# Patient Record
Sex: Female | Born: 1950
Health system: Southern US, Community
[De-identification: ages and names within clinical notes are randomized; demographics above are authoritative.]

## PROBLEM LIST (undated history)

## (undated) DIAGNOSIS — K219 Gastro-esophageal reflux disease without esophagitis: Secondary | ICD-10-CM

## (undated) DIAGNOSIS — E039 Hypothyroidism, unspecified: Secondary | ICD-10-CM

## (undated) DIAGNOSIS — E079 Disorder of thyroid, unspecified: Secondary | ICD-10-CM

## (undated) DIAGNOSIS — I10 Essential (primary) hypertension: Secondary | ICD-10-CM

## (undated) DIAGNOSIS — E785 Hyperlipidemia, unspecified: Secondary | ICD-10-CM

## (undated) DIAGNOSIS — K5790 Diverticulosis of intestine, part unspecified, without perforation or abscess without bleeding: Secondary | ICD-10-CM

## (undated) HISTORY — PX: TUBAL LIGATION: SHX77

## (undated) HISTORY — DX: Disorder of thyroid, unspecified: E07.9

## (undated) HISTORY — DX: Gastro-esophageal reflux disease without esophagitis: K21.9

## (undated) HISTORY — PX: ESOPHAGOGASTRODUODENOSCOPY: SHX1529

## (undated) HISTORY — DX: Hypothyroidism, unspecified: E03.9

## (undated) HISTORY — PX: EYE SURGERY: SHX253

## (undated) HISTORY — DX: Diverticulosis of intestine, part unspecified, without perforation or abscess without bleeding: K57.90

## (undated) HISTORY — PX: OTHER SURGICAL HISTORY: SHX169

## (undated) HISTORY — DX: Essential (primary) hypertension: I10

## (undated) HISTORY — DX: Hyperlipidemia, unspecified: E78.5

---

## 2007-02-02 ENCOUNTER — Other Ambulatory Visit: Payer: Self-pay

## 2007-02-02 ENCOUNTER — Emergency Department: Payer: Self-pay | Admitting: Emergency Medicine

## 2007-02-03 ENCOUNTER — Ambulatory Visit: Payer: Self-pay | Admitting: Emergency Medicine

## 2007-11-28 ENCOUNTER — Ambulatory Visit: Payer: Self-pay | Admitting: Gastroenterology

## 2009-03-25 ENCOUNTER — Ambulatory Visit: Payer: Self-pay | Admitting: Family Medicine

## 2010-11-02 ENCOUNTER — Ambulatory Visit: Payer: Self-pay | Admitting: Gastroenterology

## 2013-08-13 ENCOUNTER — Emergency Department: Payer: Self-pay | Admitting: Emergency Medicine

## 2013-08-13 LAB — CBC
HGB: 12.7 g/dL (ref 12.0–16.0)
MCHC: 34.2 g/dL (ref 32.0–36.0)
Platelet: 169 10*3/uL (ref 150–440)
RBC: 3.75 10*6/uL — ABNORMAL LOW (ref 3.80–5.20)
RDW: 12.8 % (ref 11.5–14.5)
WBC: 9.1 10*3/uL (ref 3.6–11.0)

## 2013-08-13 LAB — COMPREHENSIVE METABOLIC PANEL
Albumin: 3.9 g/dL (ref 3.4–5.0)
Alkaline Phosphatase: 54 U/L (ref 50–136)
Bilirubin,Total: 0.4 mg/dL (ref 0.2–1.0)
Chloride: 104 mmol/L (ref 98–107)
Co2: 26 mmol/L (ref 21–32)
EGFR (African American): >60
Glucose: 106 mg/dL — ABNORMAL HIGH (ref 65–99)
Potassium: 3.8 mmol/L (ref 3.5–5.1)
SGOT(AST): 18 U/L (ref 15–37)
SGPT (ALT): 30 U/L (ref 12–78)

## 2013-08-13 LAB — URINALYSIS, COMPLETE
Bilirubin,UR: NEGATIVE
Glucose,UR: NEGATIVE mg/dL (ref 0–75)
Nitrite: NEGATIVE
Ph: 5 (ref 4.5–8.0)
Squamous Epithelial: 3
WBC UR: 3 /HPF (ref 0–5)

## 2013-08-13 LAB — LIPASE, BLOOD: Lipase: 94 U/L (ref 73–393)

## 2014-03-25 DIAGNOSIS — E039 Hypothyroidism, unspecified: Secondary | ICD-10-CM | POA: Insufficient documentation

## 2014-03-25 DIAGNOSIS — I10 Essential (primary) hypertension: Secondary | ICD-10-CM | POA: Insufficient documentation

## 2014-03-25 DIAGNOSIS — K219 Gastro-esophageal reflux disease without esophagitis: Secondary | ICD-10-CM | POA: Insufficient documentation

## 2014-03-25 DIAGNOSIS — E785 Hyperlipidemia, unspecified: Secondary | ICD-10-CM | POA: Insufficient documentation

## 2014-04-12 DIAGNOSIS — Z87442 Personal history of urinary calculi: Secondary | ICD-10-CM | POA: Insufficient documentation

## 2015-03-19 ENCOUNTER — Ambulatory Visit
Admit: 2015-03-19 | Disposition: A | Discharge status: Home / Self Care | Payer: Self-pay | Attending: Gastroenterology | Admitting: Gastroenterology

## 2015-03-24 LAB — SURGICAL PATHOLOGY

## 2015-06-23 DIAGNOSIS — H47323 Drusen of optic disc, bilateral: Secondary | ICD-10-CM | POA: Insufficient documentation

## 2017-06-03 DIAGNOSIS — G5603 Carpal tunnel syndrome, bilateral upper limbs: Secondary | ICD-10-CM | POA: Insufficient documentation

## 2017-06-03 DIAGNOSIS — M1712 Unilateral primary osteoarthritis, left knee: Secondary | ICD-10-CM | POA: Insufficient documentation

## 2017-06-05 DIAGNOSIS — R011 Cardiac murmur, unspecified: Secondary | ICD-10-CM | POA: Insufficient documentation

## 2017-12-05 DIAGNOSIS — M51369 Other intervertebral disc degeneration, lumbar region without mention of lumbar back pain or lower extremity pain: Secondary | ICD-10-CM | POA: Insufficient documentation

## 2017-12-05 DIAGNOSIS — R7302 Impaired glucose tolerance (oral): Secondary | ICD-10-CM | POA: Insufficient documentation

## 2018-02-28 DIAGNOSIS — H2513 Age-related nuclear cataract, bilateral: Secondary | ICD-10-CM | POA: Diagnosis not present

## 2018-02-28 DIAGNOSIS — H1851 Endothelial corneal dystrophy: Secondary | ICD-10-CM | POA: Diagnosis not present

## 2018-02-28 DIAGNOSIS — H40033 Anatomical narrow angle, bilateral: Secondary | ICD-10-CM | POA: Diagnosis not present

## 2018-03-03 DIAGNOSIS — E782 Mixed hyperlipidemia: Secondary | ICD-10-CM | POA: Diagnosis not present

## 2018-03-03 DIAGNOSIS — I10 Essential (primary) hypertension: Secondary | ICD-10-CM | POA: Diagnosis not present

## 2018-03-03 DIAGNOSIS — Z0000 Encounter for general adult medical examination without abnormal findings: Secondary | ICD-10-CM | POA: Diagnosis not present

## 2018-03-03 DIAGNOSIS — M5136 Other intervertebral disc degeneration, lumbar region: Secondary | ICD-10-CM | POA: Diagnosis not present

## 2018-03-03 DIAGNOSIS — E039 Hypothyroidism, unspecified: Secondary | ICD-10-CM | POA: Diagnosis not present

## 2018-03-03 DIAGNOSIS — K219 Gastro-esophageal reflux disease without esophagitis: Secondary | ICD-10-CM | POA: Diagnosis not present

## 2018-05-29 DIAGNOSIS — H2511 Age-related nuclear cataract, right eye: Secondary | ICD-10-CM | POA: Diagnosis not present

## 2018-05-29 DIAGNOSIS — H1851 Endothelial corneal dystrophy: Secondary | ICD-10-CM | POA: Diagnosis not present

## 2018-05-29 DIAGNOSIS — I10 Essential (primary) hypertension: Secondary | ICD-10-CM | POA: Diagnosis not present

## 2018-05-29 DIAGNOSIS — Z7982 Long term (current) use of aspirin: Secondary | ICD-10-CM | POA: Diagnosis not present

## 2018-05-29 DIAGNOSIS — K219 Gastro-esophageal reflux disease without esophagitis: Secondary | ICD-10-CM | POA: Diagnosis not present

## 2018-05-29 DIAGNOSIS — Z79899 Other long term (current) drug therapy: Secondary | ICD-10-CM | POA: Diagnosis not present

## 2018-05-29 DIAGNOSIS — E785 Hyperlipidemia, unspecified: Secondary | ICD-10-CM | POA: Diagnosis not present

## 2018-05-29 DIAGNOSIS — E039 Hypothyroidism, unspecified: Secondary | ICD-10-CM | POA: Diagnosis not present

## 2018-07-03 DIAGNOSIS — R21 Rash and other nonspecific skin eruption: Secondary | ICD-10-CM | POA: Diagnosis not present

## 2018-08-21 DIAGNOSIS — R399 Unspecified symptoms and signs involving the genitourinary system: Secondary | ICD-10-CM | POA: Diagnosis not present

## 2018-08-21 DIAGNOSIS — R300 Dysuria: Secondary | ICD-10-CM | POA: Diagnosis not present

## 2018-08-21 DIAGNOSIS — R8290 Unspecified abnormal findings in urine: Secondary | ICD-10-CM | POA: Diagnosis not present

## 2018-08-21 DIAGNOSIS — M25561 Pain in right knee: Secondary | ICD-10-CM | POA: Diagnosis not present

## 2018-08-29 DIAGNOSIS — I10 Essential (primary) hypertension: Secondary | ICD-10-CM | POA: Diagnosis not present

## 2018-08-29 DIAGNOSIS — E039 Hypothyroidism, unspecified: Secondary | ICD-10-CM | POA: Diagnosis not present

## 2018-08-29 DIAGNOSIS — E782 Mixed hyperlipidemia: Secondary | ICD-10-CM | POA: Diagnosis not present

## 2018-09-05 DIAGNOSIS — M5136 Other intervertebral disc degeneration, lumbar region: Secondary | ICD-10-CM | POA: Diagnosis not present

## 2018-09-05 DIAGNOSIS — Z1239 Encounter for other screening for malignant neoplasm of breast: Secondary | ICD-10-CM | POA: Diagnosis not present

## 2018-09-05 DIAGNOSIS — I10 Essential (primary) hypertension: Secondary | ICD-10-CM | POA: Diagnosis not present

## 2018-09-05 DIAGNOSIS — E039 Hypothyroidism, unspecified: Secondary | ICD-10-CM | POA: Diagnosis not present

## 2018-09-05 DIAGNOSIS — E782 Mixed hyperlipidemia: Secondary | ICD-10-CM | POA: Diagnosis not present

## 2018-09-05 DIAGNOSIS — K219 Gastro-esophageal reflux disease without esophagitis: Secondary | ICD-10-CM | POA: Diagnosis not present

## 2018-09-11 DIAGNOSIS — H25012 Cortical age-related cataract, left eye: Secondary | ICD-10-CM | POA: Diagnosis not present

## 2018-09-11 DIAGNOSIS — Z880 Allergy status to penicillin: Secondary | ICD-10-CM | POA: Diagnosis not present

## 2018-09-11 DIAGNOSIS — Z79899 Other long term (current) drug therapy: Secondary | ICD-10-CM | POA: Diagnosis not present

## 2018-09-11 DIAGNOSIS — H1851 Endothelial corneal dystrophy: Secondary | ICD-10-CM | POA: Diagnosis not present

## 2018-09-11 DIAGNOSIS — E039 Hypothyroidism, unspecified: Secondary | ICD-10-CM | POA: Diagnosis not present

## 2018-09-11 DIAGNOSIS — K219 Gastro-esophageal reflux disease without esophagitis: Secondary | ICD-10-CM | POA: Diagnosis not present

## 2018-09-11 DIAGNOSIS — I10 Essential (primary) hypertension: Secondary | ICD-10-CM | POA: Diagnosis not present

## 2018-09-11 DIAGNOSIS — Z882 Allergy status to sulfonamides status: Secondary | ICD-10-CM | POA: Diagnosis not present

## 2018-09-11 DIAGNOSIS — Z7982 Long term (current) use of aspirin: Secondary | ICD-10-CM | POA: Diagnosis not present

## 2018-09-21 DIAGNOSIS — Z947 Corneal transplant status: Secondary | ICD-10-CM | POA: Diagnosis not present

## 2018-12-04 ENCOUNTER — Other Ambulatory Visit: Payer: Self-pay | Admitting: Internal Medicine

## 2018-12-04 DIAGNOSIS — Z1231 Encounter for screening mammogram for malignant neoplasm of breast: Secondary | ICD-10-CM

## 2018-12-12 DIAGNOSIS — M26622 Arthralgia of left temporomandibular joint: Secondary | ICD-10-CM | POA: Diagnosis not present

## 2018-12-12 DIAGNOSIS — R300 Dysuria: Secondary | ICD-10-CM | POA: Diagnosis not present

## 2018-12-12 DIAGNOSIS — R600 Localized edema: Secondary | ICD-10-CM | POA: Diagnosis not present

## 2018-12-20 ENCOUNTER — Encounter (HOSPITAL_COMMUNITY): Payer: Self-pay

## 2018-12-20 ENCOUNTER — Ambulatory Visit
Admission: RE | Admit: 2018-12-20 | Discharge: 2018-12-20 | Disposition: A | Discharge status: Home / Self Care | Payer: Medicare HMO | Source: Ambulatory Visit | Attending: Internal Medicine | Admitting: Internal Medicine

## 2018-12-20 DIAGNOSIS — Z1231 Encounter for screening mammogram for malignant neoplasm of breast: Secondary | ICD-10-CM | POA: Diagnosis not present

## 2019-01-02 DIAGNOSIS — Z1211 Encounter for screening for malignant neoplasm of colon: Secondary | ICD-10-CM | POA: Diagnosis not present

## 2019-01-02 DIAGNOSIS — Z01419 Encounter for gynecological examination (general) (routine) without abnormal findings: Secondary | ICD-10-CM | POA: Diagnosis not present

## 2019-01-02 DIAGNOSIS — Z124 Encounter for screening for malignant neoplasm of cervix: Secondary | ICD-10-CM | POA: Diagnosis not present

## 2019-01-22 DIAGNOSIS — Z1211 Encounter for screening for malignant neoplasm of colon: Secondary | ICD-10-CM | POA: Diagnosis not present

## 2019-02-12 DIAGNOSIS — H1851 Endothelial corneal dystrophy: Secondary | ICD-10-CM | POA: Diagnosis not present

## 2019-02-12 DIAGNOSIS — H47323 Drusen of optic disc, bilateral: Secondary | ICD-10-CM | POA: Diagnosis not present

## 2019-02-12 DIAGNOSIS — Z947 Corneal transplant status: Secondary | ICD-10-CM | POA: Diagnosis not present

## 2019-02-12 DIAGNOSIS — H40023 Open angle with borderline findings, high risk, bilateral: Secondary | ICD-10-CM | POA: Insufficient documentation

## 2019-02-27 DIAGNOSIS — E039 Hypothyroidism, unspecified: Secondary | ICD-10-CM | POA: Diagnosis not present

## 2019-02-27 DIAGNOSIS — I10 Essential (primary) hypertension: Secondary | ICD-10-CM | POA: Diagnosis not present

## 2019-03-07 DIAGNOSIS — E039 Hypothyroidism, unspecified: Secondary | ICD-10-CM | POA: Diagnosis not present

## 2019-03-07 DIAGNOSIS — E782 Mixed hyperlipidemia: Secondary | ICD-10-CM | POA: Diagnosis not present

## 2019-03-07 DIAGNOSIS — Z0001 Encounter for general adult medical examination with abnormal findings: Secondary | ICD-10-CM | POA: Diagnosis not present

## 2019-03-07 DIAGNOSIS — Z0000 Encounter for general adult medical examination without abnormal findings: Secondary | ICD-10-CM | POA: Diagnosis not present

## 2019-03-07 DIAGNOSIS — I10 Essential (primary) hypertension: Secondary | ICD-10-CM | POA: Diagnosis not present

## 2019-05-03 DIAGNOSIS — L237 Allergic contact dermatitis due to plants, except food: Secondary | ICD-10-CM | POA: Diagnosis not present

## 2019-08-16 DIAGNOSIS — H1851 Endothelial corneal dystrophy: Secondary | ICD-10-CM | POA: Diagnosis not present

## 2019-08-16 DIAGNOSIS — Z947 Corneal transplant status: Secondary | ICD-10-CM | POA: Diagnosis not present

## 2019-08-16 DIAGNOSIS — H40023 Open angle with borderline findings, high risk, bilateral: Secondary | ICD-10-CM | POA: Diagnosis not present

## 2019-08-31 DIAGNOSIS — I10 Essential (primary) hypertension: Secondary | ICD-10-CM | POA: Diagnosis not present

## 2019-08-31 DIAGNOSIS — E782 Mixed hyperlipidemia: Secondary | ICD-10-CM | POA: Diagnosis not present

## 2019-08-31 DIAGNOSIS — E039 Hypothyroidism, unspecified: Secondary | ICD-10-CM | POA: Diagnosis not present

## 2019-09-06 DIAGNOSIS — E782 Mixed hyperlipidemia: Secondary | ICD-10-CM | POA: Diagnosis not present

## 2019-09-06 DIAGNOSIS — E039 Hypothyroidism, unspecified: Secondary | ICD-10-CM | POA: Diagnosis not present

## 2019-09-06 DIAGNOSIS — K219 Gastro-esophageal reflux disease without esophagitis: Secondary | ICD-10-CM | POA: Diagnosis not present

## 2019-09-06 DIAGNOSIS — R002 Palpitations: Secondary | ICD-10-CM | POA: Diagnosis not present

## 2019-09-06 DIAGNOSIS — I10 Essential (primary) hypertension: Secondary | ICD-10-CM | POA: Diagnosis not present

## 2019-09-11 DIAGNOSIS — Z0100 Encounter for examination of eyes and vision without abnormal findings: Secondary | ICD-10-CM | POA: Diagnosis not present

## 2019-10-19 DIAGNOSIS — R05 Cough: Secondary | ICD-10-CM | POA: Diagnosis not present

## 2019-10-19 DIAGNOSIS — Z20828 Contact with and (suspected) exposure to other viral communicable diseases: Secondary | ICD-10-CM | POA: Diagnosis not present

## 2019-11-16 ENCOUNTER — Other Ambulatory Visit: Payer: Self-pay | Admitting: Internal Medicine

## 2019-11-16 DIAGNOSIS — Z1231 Encounter for screening mammogram for malignant neoplasm of breast: Secondary | ICD-10-CM

## 2019-12-18 DIAGNOSIS — K649 Unspecified hemorrhoids: Secondary | ICD-10-CM | POA: Diagnosis not present

## 2019-12-26 ENCOUNTER — Ambulatory Visit
Admission: RE | Admit: 2019-12-26 | Discharge: 2019-12-26 | Disposition: A | Discharge status: Home / Self Care | Payer: Medicare HMO | Source: Ambulatory Visit | Attending: Internal Medicine | Admitting: Internal Medicine

## 2019-12-26 DIAGNOSIS — Z1231 Encounter for screening mammogram for malignant neoplasm of breast: Secondary | ICD-10-CM | POA: Insufficient documentation

## 2020-01-15 DIAGNOSIS — Z124 Encounter for screening for malignant neoplasm of cervix: Secondary | ICD-10-CM | POA: Diagnosis not present

## 2020-01-30 DIAGNOSIS — Z1211 Encounter for screening for malignant neoplasm of colon: Secondary | ICD-10-CM | POA: Diagnosis not present

## 2020-01-31 DIAGNOSIS — Z01812 Encounter for preprocedural laboratory examination: Secondary | ICD-10-CM | POA: Diagnosis not present

## 2020-01-31 DIAGNOSIS — I10 Essential (primary) hypertension: Secondary | ICD-10-CM | POA: Diagnosis not present

## 2020-01-31 DIAGNOSIS — Z86010 Personal history of colonic polyps: Secondary | ICD-10-CM | POA: Diagnosis not present

## 2020-01-31 DIAGNOSIS — E785 Hyperlipidemia, unspecified: Secondary | ICD-10-CM | POA: Diagnosis not present

## 2020-01-31 DIAGNOSIS — K219 Gastro-esophageal reflux disease without esophagitis: Secondary | ICD-10-CM | POA: Diagnosis not present

## 2020-01-31 DIAGNOSIS — K649 Unspecified hemorrhoids: Secondary | ICD-10-CM | POA: Diagnosis not present

## 2020-01-31 DIAGNOSIS — R1310 Dysphagia, unspecified: Secondary | ICD-10-CM | POA: Diagnosis not present

## 2020-01-31 DIAGNOSIS — K5730 Diverticulosis of large intestine without perforation or abscess without bleeding: Secondary | ICD-10-CM | POA: Diagnosis not present

## 2020-02-12 DIAGNOSIS — H47323 Drusen of optic disc, bilateral: Secondary | ICD-10-CM | POA: Diagnosis not present

## 2020-02-12 DIAGNOSIS — H4051X3 Glaucoma secondary to other eye disorders, right eye, severe stage: Secondary | ICD-10-CM | POA: Diagnosis not present

## 2020-02-12 DIAGNOSIS — H18519 Endothelial corneal dystrophy, unspecified eye: Secondary | ICD-10-CM | POA: Diagnosis not present

## 2020-02-12 DIAGNOSIS — H04123 Dry eye syndrome of bilateral lacrimal glands: Secondary | ICD-10-CM | POA: Diagnosis not present

## 2020-03-05 DIAGNOSIS — E782 Mixed hyperlipidemia: Secondary | ICD-10-CM | POA: Diagnosis not present

## 2020-03-05 DIAGNOSIS — E039 Hypothyroidism, unspecified: Secondary | ICD-10-CM | POA: Diagnosis not present

## 2020-03-05 DIAGNOSIS — I10 Essential (primary) hypertension: Secondary | ICD-10-CM | POA: Diagnosis not present

## 2020-03-12 DIAGNOSIS — M25562 Pain in left knee: Secondary | ICD-10-CM | POA: Diagnosis not present

## 2020-03-12 DIAGNOSIS — I10 Essential (primary) hypertension: Secondary | ICD-10-CM | POA: Diagnosis not present

## 2020-03-12 DIAGNOSIS — E039 Hypothyroidism, unspecified: Secondary | ICD-10-CM | POA: Diagnosis not present

## 2020-03-12 DIAGNOSIS — R0602 Shortness of breath: Secondary | ICD-10-CM | POA: Diagnosis not present

## 2020-03-12 DIAGNOSIS — Z0000 Encounter for general adult medical examination without abnormal findings: Secondary | ICD-10-CM | POA: Diagnosis not present

## 2020-03-17 DIAGNOSIS — M25562 Pain in left knee: Secondary | ICD-10-CM | POA: Diagnosis not present

## 2020-03-17 DIAGNOSIS — M7050 Other bursitis of knee, unspecified knee: Secondary | ICD-10-CM | POA: Diagnosis not present

## 2020-03-17 DIAGNOSIS — M25561 Pain in right knee: Secondary | ICD-10-CM | POA: Diagnosis not present

## 2020-03-17 DIAGNOSIS — M2392 Unspecified internal derangement of left knee: Secondary | ICD-10-CM | POA: Diagnosis not present

## 2020-03-18 DIAGNOSIS — Z01812 Encounter for preprocedural laboratory examination: Secondary | ICD-10-CM | POA: Diagnosis not present

## 2020-03-21 DIAGNOSIS — R1310 Dysphagia, unspecified: Secondary | ICD-10-CM | POA: Diagnosis not present

## 2020-03-21 DIAGNOSIS — D128 Benign neoplasm of rectum: Secondary | ICD-10-CM | POA: Diagnosis not present

## 2020-03-21 DIAGNOSIS — K222 Esophageal obstruction: Secondary | ICD-10-CM | POA: Diagnosis not present

## 2020-03-21 DIAGNOSIS — K219 Gastro-esophageal reflux disease without esophagitis: Secondary | ICD-10-CM | POA: Diagnosis not present

## 2020-03-21 DIAGNOSIS — K621 Rectal polyp: Secondary | ICD-10-CM | POA: Diagnosis not present

## 2020-03-21 DIAGNOSIS — K641 Second degree hemorrhoids: Secondary | ICD-10-CM | POA: Diagnosis not present

## 2020-03-21 DIAGNOSIS — Z1211 Encounter for screening for malignant neoplasm of colon: Secondary | ICD-10-CM | POA: Diagnosis not present

## 2020-03-21 DIAGNOSIS — K449 Diaphragmatic hernia without obstruction or gangrene: Secondary | ICD-10-CM | POA: Diagnosis not present

## 2020-03-21 DIAGNOSIS — K5730 Diverticulosis of large intestine without perforation or abscess without bleeding: Secondary | ICD-10-CM | POA: Diagnosis not present

## 2020-03-21 DIAGNOSIS — Z86010 Personal history of colonic polyps: Secondary | ICD-10-CM | POA: Diagnosis not present

## 2020-03-28 DIAGNOSIS — R0602 Shortness of breath: Secondary | ICD-10-CM | POA: Diagnosis not present

## 2020-03-31 DIAGNOSIS — M7050 Other bursitis of knee, unspecified knee: Secondary | ICD-10-CM | POA: Diagnosis not present

## 2020-04-01 DIAGNOSIS — H4051X3 Glaucoma secondary to other eye disorders, right eye, severe stage: Secondary | ICD-10-CM | POA: Diagnosis not present

## 2020-04-02 DIAGNOSIS — I340 Nonrheumatic mitral (valve) insufficiency: Secondary | ICD-10-CM | POA: Insufficient documentation

## 2020-04-02 DIAGNOSIS — E785 Hyperlipidemia, unspecified: Secondary | ICD-10-CM | POA: Diagnosis not present

## 2020-04-02 DIAGNOSIS — I10 Essential (primary) hypertension: Secondary | ICD-10-CM | POA: Diagnosis not present

## 2020-04-02 DIAGNOSIS — I361 Nonrheumatic tricuspid (valve) insufficiency: Secondary | ICD-10-CM | POA: Insufficient documentation

## 2020-04-02 DIAGNOSIS — R0602 Shortness of breath: Secondary | ICD-10-CM | POA: Diagnosis not present

## 2020-07-07 DIAGNOSIS — E785 Hyperlipidemia, unspecified: Secondary | ICD-10-CM | POA: Diagnosis not present

## 2020-07-07 DIAGNOSIS — R0602 Shortness of breath: Secondary | ICD-10-CM | POA: Diagnosis not present

## 2020-07-07 DIAGNOSIS — I10 Essential (primary) hypertension: Secondary | ICD-10-CM | POA: Diagnosis not present

## 2020-07-07 DIAGNOSIS — I340 Nonrheumatic mitral (valve) insufficiency: Secondary | ICD-10-CM | POA: Diagnosis not present

## 2020-09-04 DIAGNOSIS — E039 Hypothyroidism, unspecified: Secondary | ICD-10-CM | POA: Diagnosis not present

## 2020-09-11 DIAGNOSIS — R21 Rash and other nonspecific skin eruption: Secondary | ICD-10-CM | POA: Diagnosis not present

## 2020-09-11 DIAGNOSIS — E039 Hypothyroidism, unspecified: Secondary | ICD-10-CM | POA: Diagnosis not present

## 2020-09-11 DIAGNOSIS — Z23 Encounter for immunization: Secondary | ICD-10-CM | POA: Diagnosis not present

## 2020-09-11 DIAGNOSIS — E785 Hyperlipidemia, unspecified: Secondary | ICD-10-CM | POA: Diagnosis not present

## 2020-09-11 DIAGNOSIS — I10 Essential (primary) hypertension: Secondary | ICD-10-CM | POA: Diagnosis not present

## 2020-10-07 DIAGNOSIS — H04123 Dry eye syndrome of bilateral lacrimal glands: Secondary | ICD-10-CM | POA: Diagnosis not present

## 2020-10-07 DIAGNOSIS — H47323 Drusen of optic disc, bilateral: Secondary | ICD-10-CM | POA: Diagnosis not present

## 2020-10-07 DIAGNOSIS — H4051X3 Glaucoma secondary to other eye disorders, right eye, severe stage: Secondary | ICD-10-CM | POA: Diagnosis not present

## 2020-10-07 DIAGNOSIS — H18513 Endothelial corneal dystrophy, bilateral: Secondary | ICD-10-CM | POA: Diagnosis not present

## 2020-10-07 DIAGNOSIS — H35363 Drusen (degenerative) of macula, bilateral: Secondary | ICD-10-CM | POA: Diagnosis not present

## 2020-10-27 DIAGNOSIS — R399 Unspecified symptoms and signs involving the genitourinary system: Secondary | ICD-10-CM | POA: Diagnosis not present

## 2020-11-11 ENCOUNTER — Other Ambulatory Visit: Payer: Self-pay | Admitting: Internal Medicine

## 2020-11-11 DIAGNOSIS — Z1231 Encounter for screening mammogram for malignant neoplasm of breast: Secondary | ICD-10-CM

## 2020-12-05 DIAGNOSIS — M5489 Other dorsalgia: Secondary | ICD-10-CM | POA: Diagnosis not present

## 2020-12-05 DIAGNOSIS — M545 Low back pain, unspecified: Secondary | ICD-10-CM | POA: Diagnosis not present

## 2020-12-26 ENCOUNTER — Ambulatory Visit
Admission: RE | Admit: 2020-12-26 | Discharge: 2020-12-26 | Disposition: A | Discharge status: Home / Self Care | Payer: Medicare HMO | Source: Ambulatory Visit | Attending: Internal Medicine | Admitting: Internal Medicine

## 2020-12-26 ENCOUNTER — Other Ambulatory Visit: Payer: Self-pay

## 2020-12-26 DIAGNOSIS — Z1231 Encounter for screening mammogram for malignant neoplasm of breast: Secondary | ICD-10-CM | POA: Diagnosis not present

## 2020-12-29 DIAGNOSIS — R011 Cardiac murmur, unspecified: Secondary | ICD-10-CM | POA: Diagnosis not present

## 2020-12-29 DIAGNOSIS — I10 Essential (primary) hypertension: Secondary | ICD-10-CM | POA: Diagnosis not present

## 2020-12-29 DIAGNOSIS — E785 Hyperlipidemia, unspecified: Secondary | ICD-10-CM | POA: Diagnosis not present

## 2020-12-29 DIAGNOSIS — I361 Nonrheumatic tricuspid (valve) insufficiency: Secondary | ICD-10-CM | POA: Diagnosis not present

## 2020-12-29 DIAGNOSIS — I340 Nonrheumatic mitral (valve) insufficiency: Secondary | ICD-10-CM | POA: Diagnosis not present

## 2021-01-15 DIAGNOSIS — Z1331 Encounter for screening for depression: Secondary | ICD-10-CM | POA: Diagnosis not present

## 2021-01-15 DIAGNOSIS — Z01419 Encounter for gynecological examination (general) (routine) without abnormal findings: Secondary | ICD-10-CM | POA: Diagnosis not present

## 2021-01-15 DIAGNOSIS — Z1321 Encounter for screening for nutritional disorder: Secondary | ICD-10-CM | POA: Diagnosis not present

## 2021-01-21 DIAGNOSIS — M8588 Other specified disorders of bone density and structure, other site: Secondary | ICD-10-CM | POA: Diagnosis not present

## 2021-02-18 DIAGNOSIS — M8588 Other specified disorders of bone density and structure, other site: Secondary | ICD-10-CM | POA: Diagnosis not present

## 2021-02-18 DIAGNOSIS — E559 Vitamin D deficiency, unspecified: Secondary | ICD-10-CM | POA: Diagnosis not present

## 2021-03-09 DIAGNOSIS — E785 Hyperlipidemia, unspecified: Secondary | ICD-10-CM | POA: Diagnosis not present

## 2021-03-09 DIAGNOSIS — E039 Hypothyroidism, unspecified: Secondary | ICD-10-CM | POA: Diagnosis not present

## 2021-03-09 DIAGNOSIS — I10 Essential (primary) hypertension: Secondary | ICD-10-CM | POA: Diagnosis not present

## 2021-03-16 DIAGNOSIS — Z0001 Encounter for general adult medical examination with abnormal findings: Secondary | ICD-10-CM | POA: Diagnosis not present

## 2021-03-16 DIAGNOSIS — I10 Essential (primary) hypertension: Secondary | ICD-10-CM | POA: Diagnosis not present

## 2021-03-16 DIAGNOSIS — R32 Unspecified urinary incontinence: Secondary | ICD-10-CM | POA: Diagnosis not present

## 2021-03-16 DIAGNOSIS — E785 Hyperlipidemia, unspecified: Secondary | ICD-10-CM | POA: Diagnosis not present

## 2021-03-16 DIAGNOSIS — E039 Hypothyroidism, unspecified: Secondary | ICD-10-CM | POA: Diagnosis not present

## 2021-03-16 DIAGNOSIS — Z0000 Encounter for general adult medical examination without abnormal findings: Secondary | ICD-10-CM | POA: Diagnosis not present

## 2021-03-19 IMAGING — MG DIGITAL SCREENING BILAT W/ TOMO W/ CAD
6 of 10 series · 6 of 30 positions shown · non-contrast
Comparison: Previous exam(s).

CLINICAL DATA: Screening.

EXAM:
DIGITAL SCREENING BILATERAL MAMMOGRAM WITH TOMO AND CAD

[R CC synth-2D (1 of 2)]
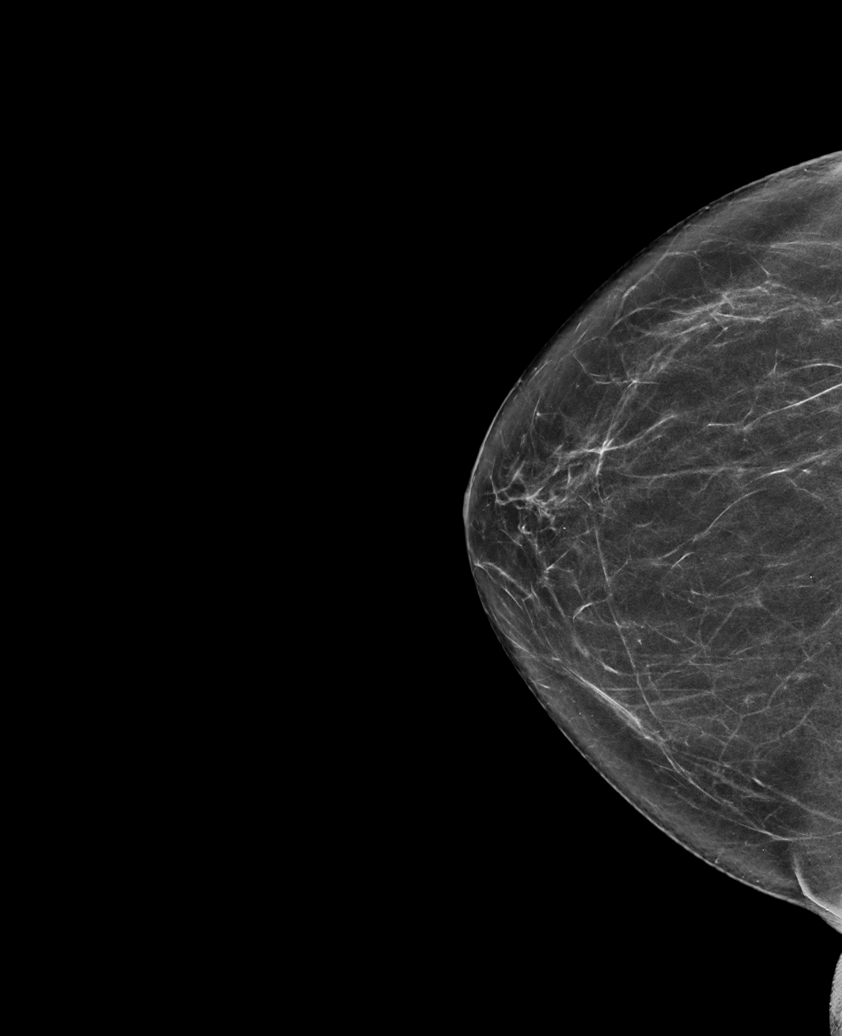

[R MLO synth-2D]
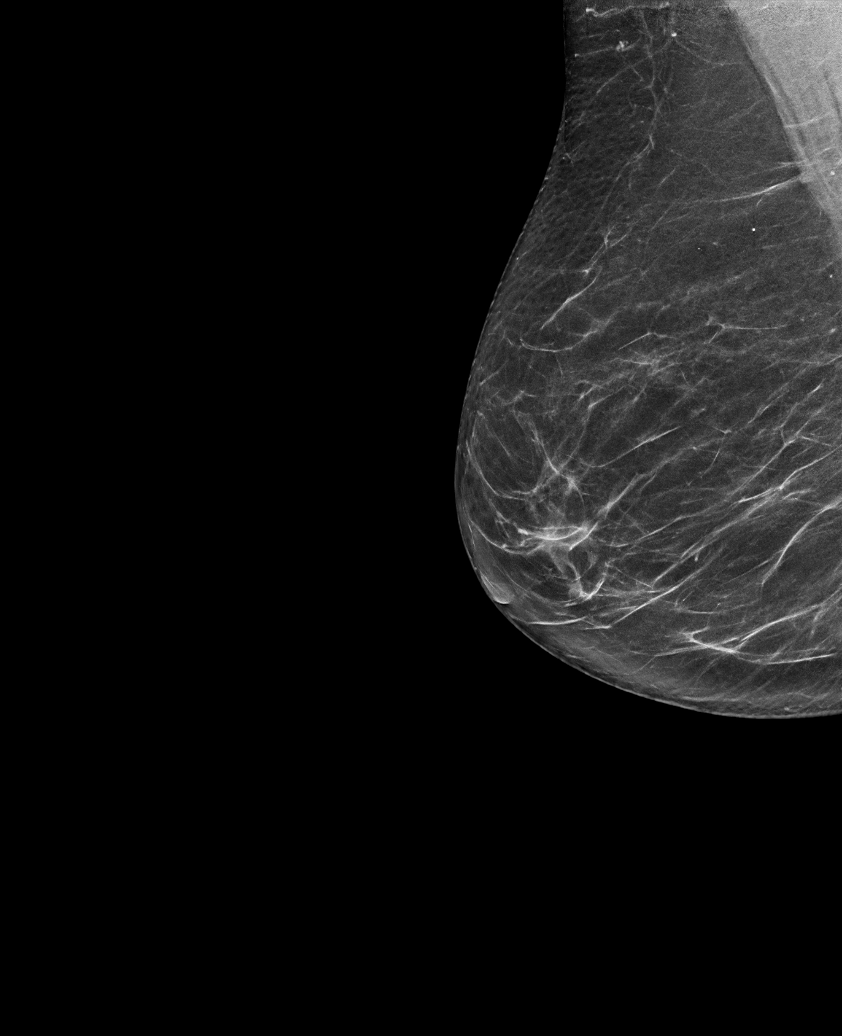

[R CC synth-2D (2 of 2)]
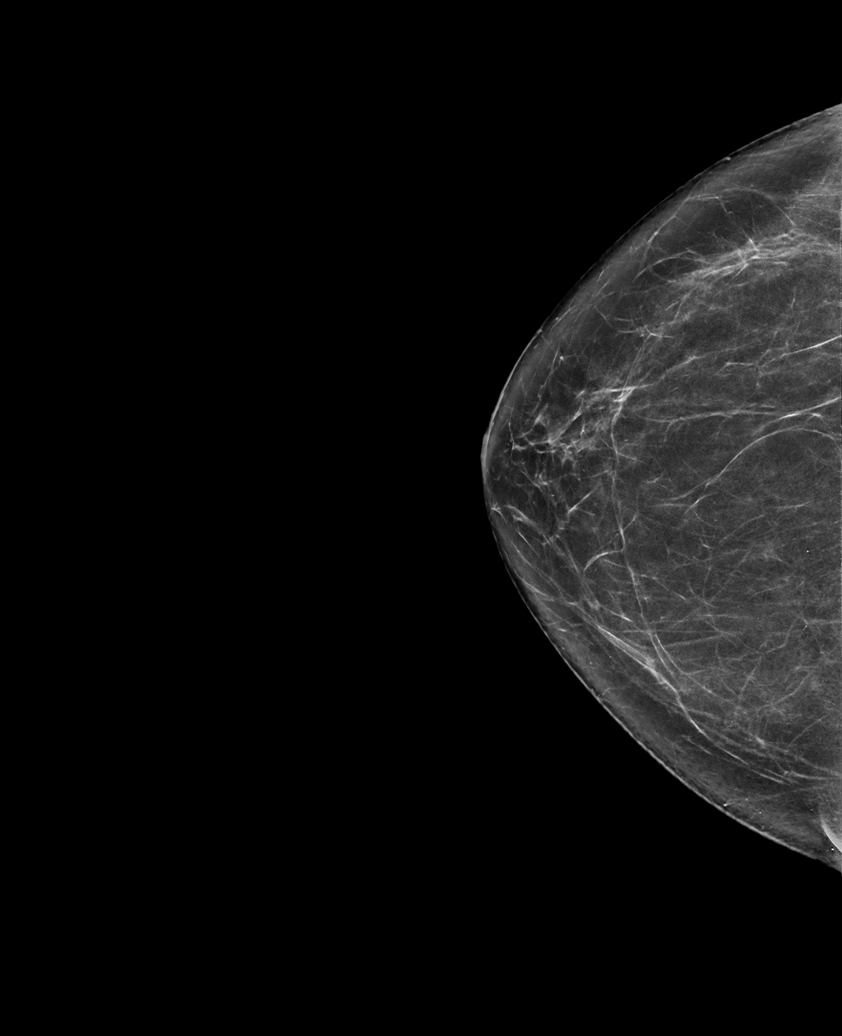

[L CC synth-2D]
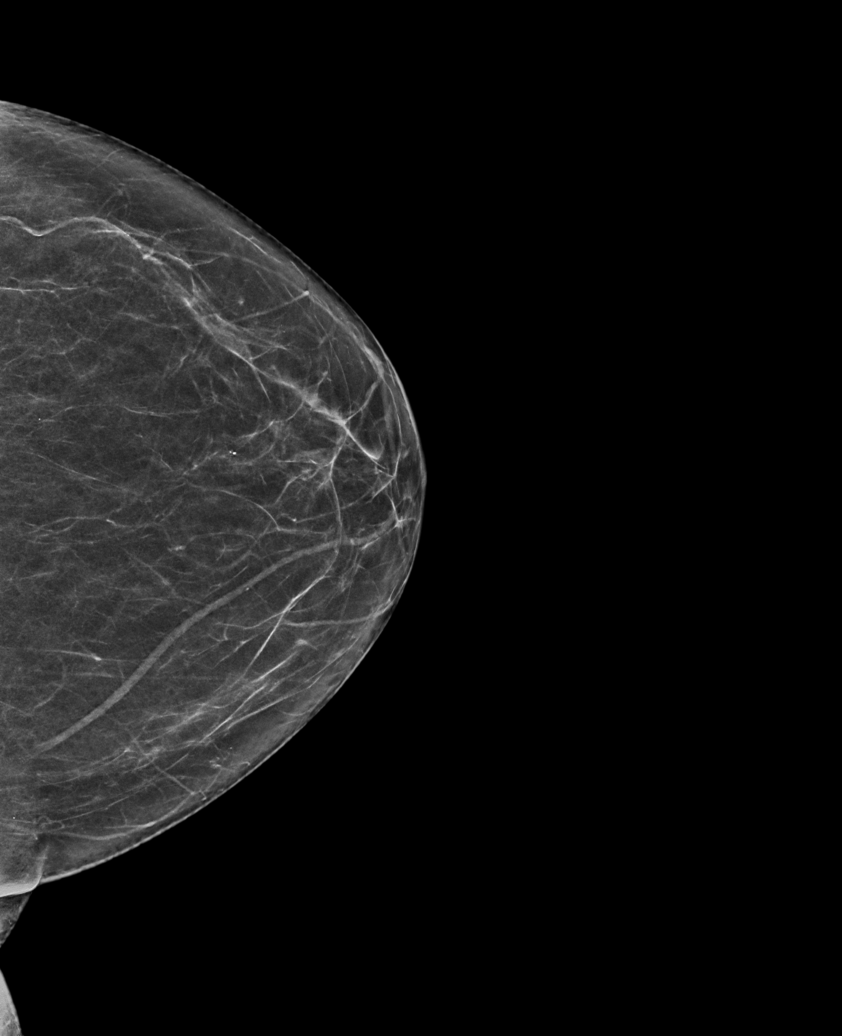

[L MLO synth-2D]
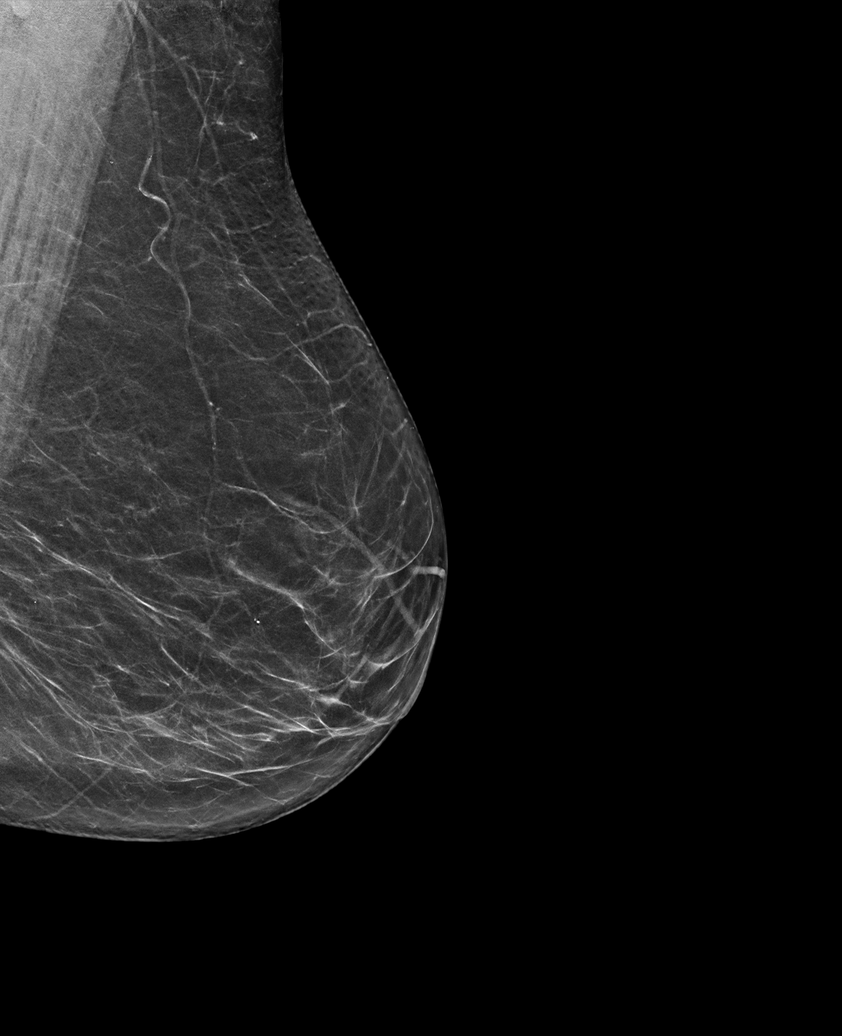

[R MLO tomo · tomo slice 37/72.0]
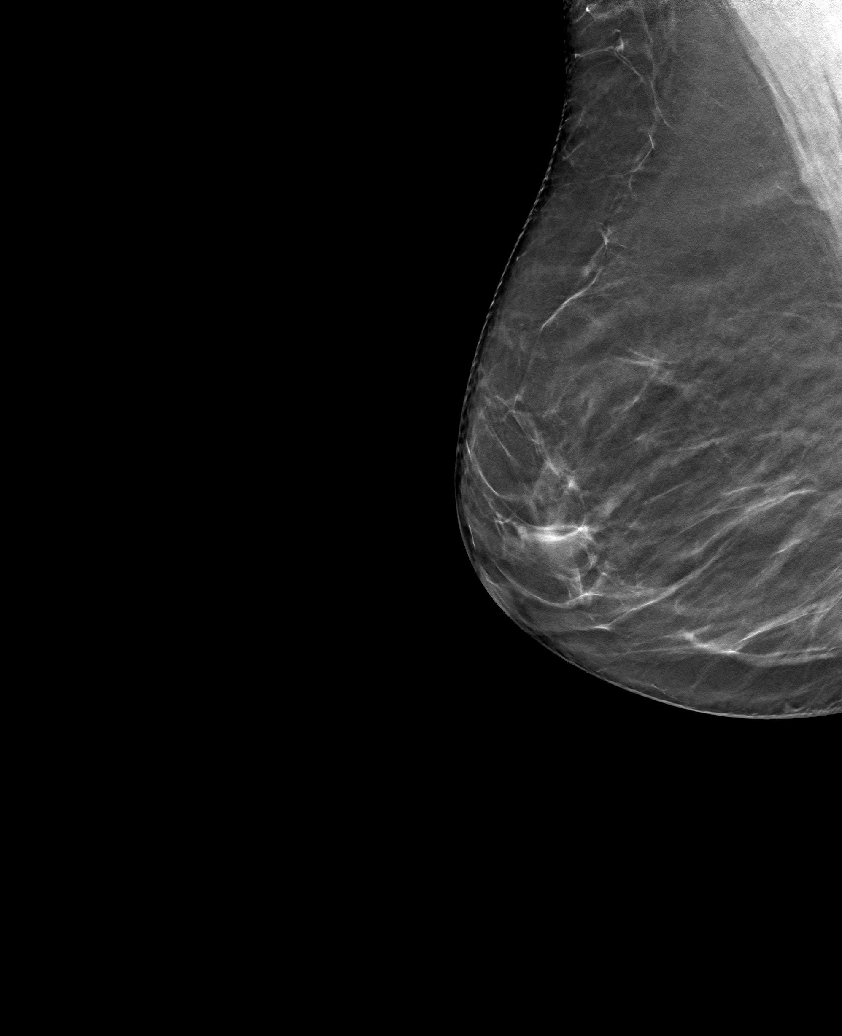

[6 of 30 positions shown; findings below may reference images not displayed]

ACR Breast Density Category b: There are scattered areas of
fibroglandular density.
FINDINGS: There are no findings suspicious for malignancy. Images were
processed with CAD.
IMPRESSION: No mammographic evidence of malignancy. A result letter of this
screening mammogram will be mailed directly to the patient.

RECOMMENDATION:
Screening mammogram in one year. (Code:[id number])

BI-RADS CATEGORY  1: Negative.

## 2021-03-26 ENCOUNTER — Other Ambulatory Visit: Payer: Self-pay

## 2021-03-26 ENCOUNTER — Ambulatory Visit: Payer: Medicare HMO | Admitting: Urology

## 2021-03-26 ENCOUNTER — Encounter: Payer: Self-pay | Admitting: Urology

## 2021-03-26 ENCOUNTER — Ambulatory Visit: Payer: Self-pay | Admitting: Urology

## 2021-03-26 VITALS — BP 152/87 | HR 64 | Ht 67.0 in | Wt 191.0 lb

## 2021-03-26 DIAGNOSIS — N3941 Urge incontinence: Secondary | ICD-10-CM | POA: Diagnosis not present

## 2021-03-26 DIAGNOSIS — R32 Unspecified urinary incontinence: Secondary | ICD-10-CM | POA: Diagnosis not present

## 2021-03-26 LAB — BLADDER SCAN AMB NON-IMAGING: Scan Result: 4

## 2021-03-26 MED ORDER — OXYBUTYNIN CHLORIDE ER 10 MG PO TB24: 10.0000 mg | ORAL_TABLET | Freq: Every day | ORAL | 11 refills | Status: DC

## 2021-03-26 NOTE — Progress Notes (Signed)
03/26/2021 2:46 PM   Summer Ramirez September 16, 1951 [id number]  Referring provider: Baxter Hire, MD [place],  [place] [address]  Chief Complaint  Patient presents with  . Urinary Incontinence    HPI: 70 year old female who presents today for further evaluation of urinary incontinence.  She previously was a patient of Dr. Dene Gentry dating back to 2015 for kidney stones.  She not had any further issues with this.  Today, she reports that she has been having increasing issues with urinary urgency, frequency and episodes of urge incontinence.  This been going on for at least 3 to 4 months but seems to be worsening.  She reports that when she stands up her symptoms even spontaneously, she will have the urge to urinate which is overwhelming.  She immediately has to get to the bathroom otherwise she will have an accident.  More recently, she is been having actual accidents now wearing safety pads.  She has about 3 accidents per week.  These are large in volume.  She is not able to suppress the urge.  She only gets up 1 or 2 times at night to void and is dry at nighttime.  She denies any issues with constipation.  No vaginal bulging or symptoms.  She is not sexually active.  No history with UTIs.  No dysuria or gross hematuria.  She has never tried any medications for OAB.  She does drink a very large volume of coffee every morning, at least a pot and a half between her and her husband.   PMH: No past medical history on file.  Surgical History: No past surgical history on file.  Home Medications:  Allergies as of 03/26/2021      Reactions   Penicillins Rash   Other reaction(s): Unknown   Sulfa Antibiotics Rash   Other reaction(s): Unknown      Medication List       Accurate as of March 26, 2021  2:46 PM. If you have any questions, ask your nurse or doctor.        acetaminophen 500 MG tablet Commonly known as: TYLENOL Take by mouth.   ALPRAZolam  0.25 MG tablet Commonly known as: XANAX Take by mouth.   calcium-vitamin D 500-200 MG-UNIT Tabs tablet Commonly known as: OSCAL WITH D Take by mouth.   levothyroxine 125 MCG tablet Commonly known as: SYNTHROID Take by mouth.   lovastatin 40 MG tablet Commonly known as: MEVACOR TAKE 1 TABLET BY MOUTH EVERY DAY WITH DINNER   Multi-Vitamin tablet Take 1 tablet by mouth daily.   oxybutynin 10 MG 24 hr tablet Commonly known as: DITROPAN-XL Take 1 tablet (10 mg total) by mouth daily. Started by: Hollice Espy, MD   prednisoLONE acetate 1 % ophthalmic suspension Commonly known as: PRED FORTE Apply to eye.   timolol 0.5 % ophthalmic solution Commonly known as: TIMOPTIC Place 1 drop into both eyes 2 (two) times daily.       Allergies:  Allergies  Allergen Reactions  . Penicillins Rash    Other reaction(s): Unknown   . Sulfa Antibiotics Rash    Other reaction(s): Unknown     Family History: Family History  Problem Relation Age of Onset  . Breast cancer Maternal Aunt     Social History:  reports that she has never smoked. She has never used smokeless tobacco. No history on file for alcohol use and drug use.   Physical Exam: BP (!) 152/87   Pulse 64  Ht 5\' 7"  (1.702 m)   Wt 191 lb (86.6 kg)   BMI 29.91 kg/m   Constitutional:  Alert and oriented, No acute distress. HEENT: [place] AT, moist mucus membranes.  Trachea midline, no masses. Cardiovascular: No clubbing, cyanosis, or edema. Respiratory: Normal respiratory effort, no increased work of breathing. Skin: No rashes, bruises or suspicious lesions. Neurologic: Grossly intact, no focal deficits, moving all 4 extremities. Psychiatric: Normal mood and affect.  Laboratory Data: Lab Results  Component Value Date   WBC 9.1 08/13/2013   HGB 12.7 08/13/2013   HCT 37.1 08/13/2013   MCV 99 08/13/2013   PLT 169 08/13/2013    Lab Results  Component Value Date   CREATININE 0.99 08/13/2013     Urinalysis Urinalysis today is negative, see Epic  Results for orders placed or performed in visit on 03/26/21  Bladder Scan (Post Void Residual) in office  Result Value Ref Range   Scan Result 4      Assessment & Plan:    1. Urge incontinence of urine Urinary urgency/urge incontinence without stress incontinence  We discussed the pathophysiology of this, has very few risk factors  That being said, she is emptying her bladder well and her urinalysis is negative thus at this point time, there is no warning symptoms to suggest that she has any other additional underlying pathology  We discussed behavioral modifications primary intervention, ideally can cut back on coffee daily  In addition see above, she is interested in pharmacotherapy.  We will start with Ditropan 10 mg XL, discussed possible side effects include dry eyes dry mouth and constipation.  Will return in about 4 to 6 weeks to reassess her symptoms/PVR.  She understands that it may take some trial of medication, adjustments, etc. until were able to more easily control her symptoms.  - Urinalysis, Complete - Bladder Scan (Post Void Residual) in office   Hollice Espy, MD  [facility] [address], [place], [place] [address] [phone]

## 2021-03-27 LAB — MICROSCOPIC EXAMINATION
Bacteria, UA: NONE SEEN
Epithelial Cells (non renal): NONE SEEN /hpf (ref 0–10)
RBC, Urine: NONE SEEN /hpf (ref 0–2)

## 2021-03-27 LAB — URINALYSIS, COMPLETE
Bilirubin, UA: NEGATIVE
Glucose, UA: NEGATIVE
Ketones, UA: NEGATIVE
Leukocytes,UA: NEGATIVE
Nitrite, UA: NEGATIVE
Protein,UA: NEGATIVE
RBC, UA: NEGATIVE
Specific Gravity, UA: 1.01 (ref 1.005–1.030)
Urobilinogen, Ur: 0.2 mg/dL (ref 0.2–1.0)
pH, UA: 6.5 (ref 5.0–7.5)

## 2021-04-14 DIAGNOSIS — H4051X3 Glaucoma secondary to other eye disorders, right eye, severe stage: Secondary | ICD-10-CM | POA: Diagnosis not present

## 2021-04-14 DIAGNOSIS — H47323 Drusen of optic disc, bilateral: Secondary | ICD-10-CM | POA: Diagnosis not present

## 2021-04-14 DIAGNOSIS — H35363 Drusen (degenerative) of macula, bilateral: Secondary | ICD-10-CM | POA: Diagnosis not present

## 2021-04-28 ENCOUNTER — Encounter: Payer: Self-pay | Admitting: Physician Assistant

## 2021-04-28 ENCOUNTER — Ambulatory Visit: Payer: Medicare HMO | Admitting: Physician Assistant

## 2021-04-28 ENCOUNTER — Other Ambulatory Visit: Payer: Self-pay

## 2021-04-28 VITALS — BP 110/71 | HR 56 | Ht 67.0 in | Wt 185.0 lb

## 2021-04-28 DIAGNOSIS — N3941 Urge incontinence: Secondary | ICD-10-CM

## 2021-04-28 LAB — BLADDER SCAN AMB NON-IMAGING: Scan Result: 37

## 2021-04-28 NOTE — Progress Notes (Signed)
04/28/2021 4:55 PM   Summer Ramirez 04-14-1951 [id number]  CC: Chief Complaint  Patient presents with  . Urinary Incontinence    HPI: Summer Ramirez is a 70 y.o. female with PMH OAB wet who presents today for symptom recheck on oxybutynin XL 10 mg daily.  Today she reports decreased frequency, urgency, and leaking episodes on oxybutynin.  She reports some dry mouth that she has been treating with Biotene mouthwash.  She denies dry eye and constipation.  Overall, she is pleased with her progress and would like to continue oxybutynin.  PVR 37 mL.  PMH: Past Medical History:  Diagnosis Date  . Diverticulosis   . GERD (gastroesophageal reflux disease)   . Hyperlipidemia   . Hypertension   . Hypothyroid   . Thyroid disease     Surgical History: Past Surgical History:  Procedure Laterality Date  . colonoscopy    . ESOPHAGOGASTRODUODENOSCOPY    . EYE SURGERY    . TUBAL LIGATION      Home Medications:  Allergies as of 04/28/2021      Reactions   Penicillins Rash   Other reaction(s): Unknown   Sulfa Antibiotics Rash   Other reaction(s): Unknown      Medication List       Accurate as of Apr 28, 2021  4:55 PM. If you have any questions, ask your nurse or doctor.        acetaminophen 500 MG tablet Commonly known as: TYLENOL Take by mouth.   ALPRAZolam 0.25 MG tablet Commonly known as: XANAX Take by mouth.   calcium-vitamin D 500-200 MG-UNIT Tabs tablet Commonly known as: OSCAL WITH D Take by mouth.   levothyroxine 125 MCG tablet Commonly known as: SYNTHROID Take by mouth.   lovastatin 40 MG tablet Commonly known as: MEVACOR TAKE 1 TABLET BY MOUTH EVERY DAY WITH DINNER   Multi-Vitamin tablet Take 1 tablet by mouth daily.   oxybutynin 10 MG 24 hr tablet Commonly known as: DITROPAN-XL Take 1 tablet (10 mg total) by mouth daily.   prednisoLONE acetate 1 % ophthalmic suspension Commonly known as: PRED FORTE Apply to eye.   timolol 0.5 %  ophthalmic solution Commonly known as: TIMOPTIC Place 1 drop into both eyes 2 (two) times daily.       Allergies:  Allergies  Allergen Reactions  . Penicillins Rash    Other reaction(s): Unknown   . Sulfa Antibiotics Rash    Other reaction(s): Unknown     Family History: Family History  Problem Relation Age of Onset  . Breast cancer Maternal Aunt     Social History:   reports that she has never smoked. She has never used smokeless tobacco. No history on file for alcohol use and drug use.  Physical Exam: BP 110/71   Pulse (!) 56   Ht 5\' 7"  (1.702 m)   Wt 185 lb (83.9 kg)   BMI 28.98 kg/m   Constitutional:  Alert and oriented, no acute distress, nontoxic appearing HEENT: [place], AT Cardiovascular: No clubbing, cyanosis, or edema Respiratory: Normal respiratory effort, no increased work of breathing Skin: No rashes, bruises or suspicious lesions Neurologic: Grossly intact, no focal deficits, moving all 4 extremities Psychiatric: Normal mood and affect  Laboratory Data: Results for orders placed or performed in visit on 04/28/21  Bladder Scan (Post Void Residual) in office  Result Value Ref Range   Scan Result 37    Assessment & Plan:   1. Urge incontinence of urine Symptomatic improvement on  oxybutynin XL 10 mg, PVR WNL today.  Patient is managing her dry mouth with appropriate OTC remedies and is not overly bothered by this.  I counseled her to continue Biotene and stay up-to-date with dental screenings, as dry mouth will increase her risk for dental caries.  She expressed understanding.  We discussed trying alternative pharmacotherapy that may not increase her risk for dry mouth, however she declines this today, which is reasonable.  Will plan for symptom recheck and PVR in 1 year, sooner if needed.  If side effects become bothersome, recommend a trial of Myrbetriq as an alternative. - Bladder Scan (Post Void Residual) in office  Return in about 1 year (around  04/28/2022) for Symptom recheck with PVR.  Debroah Loop, PA-C  [facility] [address], [place], [place] [address] [phone]

## 2021-06-30 DIAGNOSIS — E039 Hypothyroidism, unspecified: Secondary | ICD-10-CM | POA: Diagnosis not present

## 2021-06-30 DIAGNOSIS — I4891 Unspecified atrial fibrillation: Secondary | ICD-10-CM | POA: Diagnosis not present

## 2021-06-30 DIAGNOSIS — I361 Nonrheumatic tricuspid (valve) insufficiency: Secondary | ICD-10-CM | POA: Diagnosis not present

## 2021-06-30 DIAGNOSIS — R002 Palpitations: Secondary | ICD-10-CM | POA: Diagnosis not present

## 2021-06-30 DIAGNOSIS — E785 Hyperlipidemia, unspecified: Secondary | ICD-10-CM | POA: Diagnosis not present

## 2021-06-30 DIAGNOSIS — R0609 Other forms of dyspnea: Secondary | ICD-10-CM | POA: Diagnosis not present

## 2021-06-30 DIAGNOSIS — R079 Chest pain, unspecified: Secondary | ICD-10-CM | POA: Diagnosis not present

## 2021-08-05 DIAGNOSIS — R079 Chest pain, unspecified: Secondary | ICD-10-CM | POA: Diagnosis not present

## 2021-08-05 DIAGNOSIS — R0609 Other forms of dyspnea: Secondary | ICD-10-CM | POA: Diagnosis not present

## 2021-08-05 DIAGNOSIS — I361 Nonrheumatic tricuspid (valve) insufficiency: Secondary | ICD-10-CM | POA: Diagnosis not present

## 2021-09-04 DIAGNOSIS — I480 Paroxysmal atrial fibrillation: Secondary | ICD-10-CM | POA: Insufficient documentation

## 2021-09-04 DIAGNOSIS — I340 Nonrheumatic mitral (valve) insufficiency: Secondary | ICD-10-CM | POA: Diagnosis not present

## 2021-09-04 DIAGNOSIS — I10 Essential (primary) hypertension: Secondary | ICD-10-CM | POA: Diagnosis not present

## 2021-09-04 DIAGNOSIS — E785 Hyperlipidemia, unspecified: Secondary | ICD-10-CM | POA: Diagnosis not present

## 2021-09-04 DIAGNOSIS — I361 Nonrheumatic tricuspid (valve) insufficiency: Secondary | ICD-10-CM | POA: Diagnosis not present

## 2021-09-04 DIAGNOSIS — Z23 Encounter for immunization: Secondary | ICD-10-CM | POA: Diagnosis not present

## 2021-09-04 DIAGNOSIS — E039 Hypothyroidism, unspecified: Secondary | ICD-10-CM | POA: Diagnosis not present

## 2021-09-07 DIAGNOSIS — I480 Paroxysmal atrial fibrillation: Secondary | ICD-10-CM | POA: Diagnosis not present

## 2021-09-08 DIAGNOSIS — E039 Hypothyroidism, unspecified: Secondary | ICD-10-CM | POA: Diagnosis not present

## 2021-09-15 DIAGNOSIS — Z23 Encounter for immunization: Secondary | ICD-10-CM | POA: Diagnosis not present

## 2021-09-15 DIAGNOSIS — E039 Hypothyroidism, unspecified: Secondary | ICD-10-CM | POA: Diagnosis not present

## 2021-09-15 DIAGNOSIS — I10 Essential (primary) hypertension: Secondary | ICD-10-CM | POA: Diagnosis not present

## 2021-09-15 DIAGNOSIS — I480 Paroxysmal atrial fibrillation: Secondary | ICD-10-CM | POA: Diagnosis not present

## 2021-09-15 DIAGNOSIS — K219 Gastro-esophageal reflux disease without esophagitis: Secondary | ICD-10-CM | POA: Diagnosis not present

## 2021-10-13 DIAGNOSIS — H4051X3 Glaucoma secondary to other eye disorders, right eye, severe stage: Secondary | ICD-10-CM | POA: Diagnosis not present

## 2021-11-13 ENCOUNTER — Other Ambulatory Visit: Payer: Self-pay | Admitting: Internal Medicine

## 2021-11-13 DIAGNOSIS — Z1231 Encounter for screening mammogram for malignant neoplasm of breast: Secondary | ICD-10-CM

## 2021-11-17 DIAGNOSIS — M8588 Other specified disorders of bone density and structure, other site: Secondary | ICD-10-CM | POA: Diagnosis not present

## 2021-11-17 DIAGNOSIS — M47816 Spondylosis without myelopathy or radiculopathy, lumbar region: Secondary | ICD-10-CM | POA: Diagnosis not present

## 2021-11-17 DIAGNOSIS — M545 Low back pain, unspecified: Secondary | ICD-10-CM | POA: Diagnosis not present

## 2021-11-17 DIAGNOSIS — M5431 Sciatica, right side: Secondary | ICD-10-CM | POA: Diagnosis not present

## 2021-11-17 DIAGNOSIS — M5441 Lumbago with sciatica, right side: Secondary | ICD-10-CM | POA: Diagnosis not present

## 2021-11-17 DIAGNOSIS — M25551 Pain in right hip: Secondary | ICD-10-CM | POA: Diagnosis not present

## 2021-11-17 DIAGNOSIS — M5440 Lumbago with sciatica, unspecified side: Secondary | ICD-10-CM | POA: Diagnosis not present

## 2021-12-18 DIAGNOSIS — I480 Paroxysmal atrial fibrillation: Secondary | ICD-10-CM | POA: Diagnosis not present

## 2021-12-18 DIAGNOSIS — I10 Essential (primary) hypertension: Secondary | ICD-10-CM | POA: Diagnosis not present

## 2021-12-18 DIAGNOSIS — E785 Hyperlipidemia, unspecified: Secondary | ICD-10-CM | POA: Diagnosis not present

## 2021-12-18 DIAGNOSIS — I361 Nonrheumatic tricuspid (valve) insufficiency: Secondary | ICD-10-CM | POA: Diagnosis not present

## 2021-12-18 DIAGNOSIS — I340 Nonrheumatic mitral (valve) insufficiency: Secondary | ICD-10-CM | POA: Diagnosis not present

## 2021-12-28 ENCOUNTER — Other Ambulatory Visit: Payer: Self-pay

## 2021-12-28 ENCOUNTER — Ambulatory Visit
Admission: RE | Admit: 2021-12-28 | Discharge: 2021-12-28 | Disposition: A | Discharge status: Home / Self Care | Payer: Medicare HMO | Source: Ambulatory Visit | Attending: Internal Medicine | Admitting: Internal Medicine

## 2021-12-28 DIAGNOSIS — Z1231 Encounter for screening mammogram for malignant neoplasm of breast: Secondary | ICD-10-CM | POA: Diagnosis not present

## 2022-01-26 DIAGNOSIS — N83292 Other ovarian cyst, left side: Secondary | ICD-10-CM | POA: Diagnosis not present

## 2022-01-26 DIAGNOSIS — Z124 Encounter for screening for malignant neoplasm of cervix: Secondary | ICD-10-CM | POA: Diagnosis not present

## 2022-01-26 DIAGNOSIS — E039 Hypothyroidism, unspecified: Secondary | ICD-10-CM | POA: Diagnosis not present

## 2022-01-26 DIAGNOSIS — Z1331 Encounter for screening for depression: Secondary | ICD-10-CM | POA: Diagnosis not present

## 2022-01-26 DIAGNOSIS — F4323 Adjustment disorder with mixed anxiety and depressed mood: Secondary | ICD-10-CM | POA: Diagnosis not present

## 2022-01-26 DIAGNOSIS — Z01419 Encounter for gynecological examination (general) (routine) without abnormal findings: Secondary | ICD-10-CM | POA: Diagnosis not present

## 2022-02-05 ENCOUNTER — Other Ambulatory Visit: Payer: Self-pay

## 2022-02-05 ENCOUNTER — Emergency Department
Admission: EM | Admit: 2022-02-05 | Discharge: 2022-02-05 | Disposition: A | Discharge status: Home / Self Care | Payer: Medicare HMO | Attending: Emergency Medicine | Admitting: Emergency Medicine

## 2022-02-05 ENCOUNTER — Encounter: Payer: Self-pay | Admitting: Emergency Medicine

## 2022-02-05 DIAGNOSIS — E039 Hypothyroidism, unspecified: Secondary | ICD-10-CM | POA: Insufficient documentation

## 2022-02-05 DIAGNOSIS — I10 Essential (primary) hypertension: Secondary | ICD-10-CM | POA: Diagnosis not present

## 2022-02-05 DIAGNOSIS — R21 Rash and other nonspecific skin eruption: Secondary | ICD-10-CM | POA: Diagnosis not present

## 2022-02-05 DIAGNOSIS — D72829 Elevated white blood cell count, unspecified: Secondary | ICD-10-CM | POA: Insufficient documentation

## 2022-02-05 DIAGNOSIS — L299 Pruritus, unspecified: Secondary | ICD-10-CM | POA: Diagnosis not present

## 2022-02-05 DIAGNOSIS — Z79899 Other long term (current) drug therapy: Secondary | ICD-10-CM | POA: Diagnosis not present

## 2022-02-05 DIAGNOSIS — T7840XA Allergy, unspecified, initial encounter: Secondary | ICD-10-CM | POA: Diagnosis not present

## 2022-02-05 DIAGNOSIS — T782XXA Anaphylactic shock, unspecified, initial encounter: Secondary | ICD-10-CM | POA: Diagnosis not present

## 2022-02-05 LAB — CBC WITH DIFFERENTIAL/PLATELET
Abs Immature Granulocytes: 0.06 10*3/uL (ref 0.00–0.07)
Basophils Absolute: 0 10*3/uL (ref 0.0–0.1)
Basophils Relative: 0 %
Eosinophils Absolute: 0 10*3/uL (ref 0.0–0.5)
Eosinophils Relative: 0 %
HCT: 41.5 % (ref 36.0–46.0)
Hemoglobin: 14.2 g/dL (ref 12.0–15.0)
Immature Granulocytes: 1 %
Lymphocytes Relative: 11 %
Lymphs Abs: 1.3 10*3/uL (ref 0.7–4.0)
MCH: 33 pg (ref 26.0–34.0)
MCHC: 34.2 g/dL (ref 30.0–36.0)
MCV: 96.5 fL (ref 80.0–100.0)
Monocytes Absolute: 0.5 10*3/uL (ref 0.1–1.0)
Monocytes Relative: 4 %
Neutro Abs: 10.4 10*3/uL — ABNORMAL HIGH (ref 1.7–7.7)
Neutrophils Relative %: 84 %
Platelets: 231 10*3/uL (ref 150–400)
RBC: 4.3 MIL/uL (ref 3.87–5.11)
RDW: 12.5 % (ref 11.5–15.5)
WBC: 12.3 10*3/uL — ABNORMAL HIGH (ref 4.0–10.5)
nRBC: 0 % (ref 0.0–0.2)

## 2022-02-05 LAB — COMPREHENSIVE METABOLIC PANEL
ALT: 15 U/L (ref 0–44)
AST: 19 U/L (ref 15–41)
Albumin: 3.3 g/dL — ABNORMAL LOW (ref 3.5–5.0)
Alkaline Phosphatase: 44 U/L (ref 38–126)
Anion gap: 8 (ref 5–15)
BUN: 12 mg/dL (ref 8–23)
CO2: 24 mmol/L (ref 22–32)
Calcium: 7.9 mg/dL — ABNORMAL LOW (ref 8.9–10.3)
Chloride: 97 mmol/L — ABNORMAL LOW (ref 98–111)
Creatinine, Ser: 0.64 mg/dL (ref 0.44–1.00)
GFR, Estimated: >60 mL/min (ref >60)
Glucose, Bld: 142 mg/dL — ABNORMAL HIGH (ref 70–99)
Potassium: 3.5 mmol/L (ref 3.5–5.1)
Sodium: 129 mmol/L — ABNORMAL LOW (ref 135–145)
Total Bilirubin: 0.6 mg/dL (ref 0.3–1.2)
Total Protein: 5.9 g/dL — ABNORMAL LOW (ref 6.5–8.1)

## 2022-02-05 LAB — LIPASE, BLOOD: Lipase: 28 U/L (ref 11–51)

## 2022-02-05 MED ORDER — DEXAMETHASONE SODIUM PHOSPHATE 10 MG/ML IJ SOLN
10.0000 mg | Freq: Once | INTRAMUSCULAR | Status: AC
  Administered 2022-02-05: 10 mg via INTRAVENOUS
  Filled 2022-02-05: qty 1

## 2022-02-05 MED ORDER — EPINEPHRINE 0.3 MG/0.3ML IJ SOAJ: 0.3000 mg | INTRAMUSCULAR | 0 refills | Status: DC | PRN

## 2022-02-05 MED ORDER — EPINEPHRINE 0.3 MG/0.3ML IJ SOAJ
0.3000 mg | Freq: Once | INTRAMUSCULAR | Status: AC
  Administered 2022-02-05: 0.3 mg via INTRAMUSCULAR
  Filled 2022-02-05: qty 0.3

## 2022-02-05 MED ORDER — FAMOTIDINE IN NACL 20-0.9 MG/50ML-% IV SOLN
20.0000 mg | Freq: Once | INTRAVENOUS | Status: AC
  Administered 2022-02-05: 20 mg via INTRAVENOUS
  Filled 2022-02-05: qty 50

## 2022-02-05 MED ORDER — LACTATED RINGERS IV SOLN: INTRAVENOUS | Status: DC

## 2022-02-05 NOTE — ED Notes (Signed)
Patient unable to provide urine sample at this time

## 2022-02-05 NOTE — ED Triage Notes (Addendum)
EMS called out for "breathing problems"  Pt has good respirations and oxygen sats with EMS.  NSR with EMS  Pt has rash to body that developed about 1400 today.  Started new medication one week ago, citalopram, per patient.  Given 50 mg benadryl IV en route.  No shortness of breath. Angioedema, or feelings of swelling in the mouth/throat.  Pt states itching is better after receiving benadryl with EMS.  ?

## 2022-02-05 NOTE — ED Provider Notes (Signed)
? ?[facility] ?Provider Note ? ? ? Event Date/Time  ? First MD Initiated Contact with Patient 02/05/22 1612   ?  (approximate) ? ? ?History  ? ?Allergic Reaction ? ? ?HPI ? ?Summer Ramirez is a 71 y.o. female presents history of HTN, HPL, GERD, hypothyroidism, diverticulosis, osteopenia and benign ovarian cyst presents via EMS for evaluation of diffuse whole body rash that started this afternoon.  Patient states he started feeling poorly this morning.  She states she developed some crampy abdominal discomfort earlier this morning and felt like she had a bowel movement and went to sit on the toilet.  She states he felt very clammy and took a while no bowel movement yet.  She states that she later developed whole body itchy rash.  He states he was breathing fast but did not have any shortness of breath or wheezing or sore throat.  No nausea, vomiting ?  ?Past Medical History:  ?Diagnosis Date  ? Diverticulosis   ? GERD (gastroesophageal reflux disease)   ? Hyperlipidemia   ? Hypertension   ? Hypothyroid   ? Thyroid disease   ? ? ? ?Physical Exam  ?Triage Vital Signs: ?ED Triage Vitals  ?Enc Vitals Group  ?   BP 02/05/22 1614 128/73  ?   Pulse Rate 02/05/22 1614 60  ?   Resp 02/05/22 1614 18  ?   Temp 02/05/22 1614 97.8 ?F (36.6 ?C)  ?   Temp Source 02/05/22 1614 Oral  ?   SpO2 02/05/22 1614 100 %  ?   Weight --   ?   Height --   ?   Head Circumference --   ?   Peak Flow --   ?   Pain Score 02/05/22 1612 0  ?   Pain Loc --   ?   Pain Edu? --   ?   Excl. in [place]? --   ? ? ?Most recent vital signs: ?Vitals:  ? 02/05/22 1830 02/05/22 1900  ?BP: 117/73 120/72  ?Pulse: 62 (!) 58  ?Resp: 17 15  ?Temp:    ?SpO2: 96% 99%  ? ? ?General: Awake, no distress.  ?CV:  Good peripheral perfusion.  2+ radial pulses.  No murmur.  Slightly bradycardic. ?Resp:  Normal effort.  Clear bilaterally ?Abd:  No distention.  Soft throughout. ?Other:  Maculopapular blanchable rash over the extremities neck and torso.  There  is no fluctuance, induration, streaking, blistering or other overlying skin changes. ? ? ?ED Results / Procedures / Treatments  ?Labs ?(all labs ordered are listed, but only abnormal results are displayed) ?Labs Reviewed  ?CBC WITH DIFFERENTIAL/PLATELET - Abnormal; Notable for the following components:  ?    Result Value  ? WBC 12.3 (*)   ? Neutro Abs 10.4 (*)   ? All other components within normal limits  ?COMPREHENSIVE METABOLIC PANEL - Abnormal; Notable for the following components:  ? Sodium 129 (*)   ? Chloride 97 (*)   ? Glucose, Bld 142 (*)   ? Calcium 7.9 (*)   ? Total Protein 5.9 (*)   ? Albumin 3.3 (*)   ? All other components within normal limits  ?LIPASE, BLOOD  ?URINALYSIS, COMPLETE (UACMP) WITH MICROSCOPIC  ? ? ? ?EKG ? ?ECG shows sinus rhythm with a ventricular rate of 59, otherwise unremarkable intervals without evidence of acute ischemia ? ? ?RADIOLOGY ? ? ? ?PROCEDURES: ? ?Critical Care performed: No ? ?.Critical Care ?Performed by: Lucrezia Starch, MD ?  Authorized by: Lucrezia Starch, MD  ? ?Critical care provider statement:  ?  Critical care time (minutes):  30 ?  Critical care was necessary to treat or prevent imminent or life-threatening deterioration of the following conditions: anaphylaxis. ?  Critical care was time spent personally by me on the following activities:  Development of treatment plan with patient or surrogate, discussions with consultants, evaluation of patient's response to treatment, examination of patient, ordering and review of laboratory studies, ordering and review of radiographic studies, ordering and performing treatments and interventions, pulse oximetry, re-evaluation of patient's condition and review of old charts ? ? ? ?MEDICATIONS ORDERED IN ED: ?Medications  ?lactated ringers infusion ( Intravenous New Bag/Given 02/05/22 1831)  ?EPINEPHrine (EPI-PEN) injection 0.3 mg (0.3 mg Intramuscular Given 02/05/22 1657)  ?dexamethasone (DECADRON) injection 10 mg (10 mg  Intravenous Given 02/05/22 1653)  ?famotidine (PEPCID) IVPB 20 mg premix (0 mg Intravenous Stopped 02/05/22 1705)  ? ? ? ?IMPRESSION / MDM / ASSESSMENT AND PLAN / ED COURSE  ?I reviewed the triage vital signs and the nursing notes. ?             ?               ? ?Differential diagnosis includes, but is not limited to anaphylaxis, local wound abdominal discomfort clamminess, metabolic derangements, acute pancreatitis, anemia with lower suspicion for other infectious process trauma, CTA AAA, PE infection. ? ?ECG shows sinus rhythm with a ventricular rate of 59, otherwise unremarkable intervals without evidence of acute ischemia.  This is not suggestive of atypical presentation for ACS. ? ?CBC shows WBC count 12.3 without evidence of acute ischemia.  BMP is remarkable for a sodium 129 calcium 7.9 without other significant electrolyte or metabolic derangements.  Lipase not suggestive pancreatitis.  LFTs are unremarkable with cholestatic process. ? ?Patient presents flexes on arrival on several reassessments that she was feeling much better.  Rash was noted to completely resolved.  Given she reports clamminess and feeling like she was about to pass out with lightheadedness as well as some associated diarrhea concern for anaphylactic episode with unclear trigger.  Given she has not required any additional doses of epinephrine is feeling much better after 3 hours I do not believe she requires additional observation although will prescribe EpiPen.  Discussed appropriate use of calling 911 if she feels the need to use it.  Otherwise I think at this point she can follow-up with her primary care doctor to have her electrolytes including sodium rechecked.  She is amenable this plan.  She denies any other acute concerns.  Discharged in stable condition.  Strict precautions advised and discussed. ? ?  ? ? ?FINAL CLINICAL IMPRESSION(S) / ED DIAGNOSES  ? ?Final diagnoses:  ?Anaphylaxis, initial encounter  ? ? ? ?Rx / DC Orders   ? ?ED Discharge Orders   ? ?      Ordered  ?  EPINEPHrine 0.3 mg/0.3 mL IJ SOAJ injection  As needed       ? 02/05/22 1940  ? ?  ?  ? ?  ? ? ? ?Note:  This document was prepared using Dragon voice recognition software and may include unintentional dictation errors. ?  ?Lucrezia Starch, MD ?02/05/22 1942 ? ?

## 2022-02-05 NOTE — ED Notes (Signed)
Patient resting comfortably on stretcher in room. RR even and unlabored. Patient states she is much better and the hives are gone. Her cheeks are still rosey but she states that is normal for her. Patient verbalizes no other needs or complaints at this time. Visitor at bedside. ?

## 2022-02-05 NOTE — ED Notes (Signed)
Discharge instructions, script information and follow-up provided to patient. Patient verbalized understanding. Patient ambulated out to the waiting room with a steady gait without assistance.  ?

## 2022-02-09 DIAGNOSIS — R21 Rash and other nonspecific skin eruption: Secondary | ICD-10-CM | POA: Diagnosis not present

## 2022-02-09 DIAGNOSIS — Z09 Encounter for follow-up examination after completed treatment for conditions other than malignant neoplasm: Secondary | ICD-10-CM | POA: Diagnosis not present

## 2022-02-09 DIAGNOSIS — N83202 Unspecified ovarian cyst, left side: Secondary | ICD-10-CM | POA: Diagnosis not present

## 2022-02-09 DIAGNOSIS — I10 Essential (primary) hypertension: Secondary | ICD-10-CM | POA: Diagnosis not present

## 2022-02-09 DIAGNOSIS — N83292 Other ovarian cyst, left side: Secondary | ICD-10-CM | POA: Diagnosis not present

## 2022-02-09 DIAGNOSIS — E871 Hypo-osmolality and hyponatremia: Secondary | ICD-10-CM | POA: Diagnosis not present

## 2022-03-01 DIAGNOSIS — M722 Plantar fascial fibromatosis: Secondary | ICD-10-CM | POA: Diagnosis not present

## 2022-03-02 DIAGNOSIS — H1132 Conjunctival hemorrhage, left eye: Secondary | ICD-10-CM | POA: Diagnosis not present

## 2022-03-02 DIAGNOSIS — H4051X3 Glaucoma secondary to other eye disorders, right eye, severe stage: Secondary | ICD-10-CM | POA: Diagnosis not present

## 2022-03-02 DIAGNOSIS — H47323 Drusen of optic disc, bilateral: Secondary | ICD-10-CM | POA: Diagnosis not present

## 2022-03-10 DIAGNOSIS — I10 Essential (primary) hypertension: Secondary | ICD-10-CM | POA: Diagnosis not present

## 2022-03-10 DIAGNOSIS — E039 Hypothyroidism, unspecified: Secondary | ICD-10-CM | POA: Diagnosis not present

## 2022-03-16 DIAGNOSIS — H524 Presbyopia: Secondary | ICD-10-CM | POA: Diagnosis not present

## 2022-03-16 DIAGNOSIS — Z0100 Encounter for examination of eyes and vision without abnormal findings: Secondary | ICD-10-CM | POA: Diagnosis not present

## 2022-03-17 DIAGNOSIS — F324 Major depressive disorder, single episode, in partial remission: Secondary | ICD-10-CM | POA: Diagnosis not present

## 2022-03-17 DIAGNOSIS — I10 Essential (primary) hypertension: Secondary | ICD-10-CM | POA: Diagnosis not present

## 2022-03-17 DIAGNOSIS — E785 Hyperlipidemia, unspecified: Secondary | ICD-10-CM | POA: Diagnosis not present

## 2022-03-17 DIAGNOSIS — Z0001 Encounter for general adult medical examination with abnormal findings: Secondary | ICD-10-CM | POA: Diagnosis not present

## 2022-03-17 DIAGNOSIS — Z0000 Encounter for general adult medical examination without abnormal findings: Secondary | ICD-10-CM | POA: Diagnosis not present

## 2022-03-17 DIAGNOSIS — I480 Paroxysmal atrial fibrillation: Secondary | ICD-10-CM | POA: Diagnosis not present

## 2022-03-17 DIAGNOSIS — E039 Hypothyroidism, unspecified: Secondary | ICD-10-CM | POA: Diagnosis not present

## 2022-03-17 DIAGNOSIS — Z1389 Encounter for screening for other disorder: Secondary | ICD-10-CM | POA: Diagnosis not present

## 2022-03-17 DIAGNOSIS — N83202 Unspecified ovarian cyst, left side: Secondary | ICD-10-CM | POA: Diagnosis not present

## 2022-03-29 DIAGNOSIS — M722 Plantar fascial fibromatosis: Secondary | ICD-10-CM | POA: Diagnosis not present

## 2022-04-16 DIAGNOSIS — I480 Paroxysmal atrial fibrillation: Secondary | ICD-10-CM | POA: Diagnosis not present

## 2022-04-16 DIAGNOSIS — I340 Nonrheumatic mitral (valve) insufficiency: Secondary | ICD-10-CM | POA: Diagnosis not present

## 2022-04-16 DIAGNOSIS — I10 Essential (primary) hypertension: Secondary | ICD-10-CM | POA: Diagnosis not present

## 2022-04-16 DIAGNOSIS — E785 Hyperlipidemia, unspecified: Secondary | ICD-10-CM | POA: Diagnosis not present

## 2022-04-29 ENCOUNTER — Ambulatory Visit: Payer: Medicare HMO | Admitting: Physician Assistant

## 2022-04-29 ENCOUNTER — Ambulatory Visit: Payer: Self-pay | Admitting: Urology

## 2022-04-29 ENCOUNTER — Encounter: Payer: Self-pay | Admitting: Physician Assistant

## 2022-04-29 VITALS — BP 129/69 | HR 55 | Ht 67.0 in | Wt 184.0 lb

## 2022-04-29 DIAGNOSIS — N200 Calculus of kidney: Secondary | ICD-10-CM

## 2022-04-29 DIAGNOSIS — N3941 Urge incontinence: Secondary | ICD-10-CM

## 2022-04-29 LAB — BLADDER SCAN AMB NON-IMAGING

## 2022-04-29 MED ORDER — MIRABEGRON ER 50 MG PO TB24: 50.0000 mg | ORAL_TABLET | Freq: Every day | ORAL | 0 refills | Status: DC

## 2022-04-29 NOTE — Progress Notes (Signed)
04/29/2022 1:39 PM   Summer Ramirez 1950-12-18 [id number]  CC: Chief Complaint  Patient presents with   Urinary Incontinence    1 year follow up   HPI: Summer Ramirez is a 71 y.o. female with PMH nephrolithiasis and OAB wet on oxybutynin XL 10 mg daily who presents today for annual follow-up.   Today she reports stable urinary symptoms over the past year.  She continues to have some urinary urgency, urge incontinence, and leaking without awareness.  She wears 3 pads daily and they are moist.  She continues to have dry mouth due to her oxybutynin and is using Biotene products to manage these.  She denies dysuria, hematuria, and flank pain.  PVR 0 mL  PMH: Past Medical History:  Diagnosis Date   Diverticulosis    GERD (gastroesophageal reflux disease)    Hyperlipidemia    Hypertension    Hypothyroid    Thyroid disease     Surgical History: Past Surgical History:  Procedure Laterality Date   colonoscopy     ESOPHAGOGASTRODUODENOSCOPY     EYE SURGERY     TUBAL LIGATION      Home Medications:  Allergies as of 04/29/2022       Reactions   Penicillins Rash   Other reaction(s): Unknown   Sulfa Antibiotics Rash   Other reaction(s): Unknown        Medication List        Accurate as of April 29, 2022  1:39 PM. If you have any questions, ask your nurse or doctor.          STOP taking these medications    calcium-vitamin D 500-200 MG-UNIT Tabs tablet Commonly known as: OSCAL WITH D Stopped by: Debroah Loop, PA-C   EPINEPHrine 0.3 mg/0.3 mL Soaj injection Commonly known as: EPI-PEN Stopped by: Debroah Loop, PA-C   oxybutynin 10 MG 24 hr tablet Commonly known as: DITROPAN-XL Stopped by: Debroah Loop, PA-C       TAKE these medications    acetaminophen 500 MG tablet Commonly known as: TYLENOL Take by mouth.   ALPRAZolam 0.25 MG tablet Commonly known as: XANAX Take by mouth.   citalopram 20 MG tablet Commonly known as:  CELEXA Take 20 mg by mouth daily.   Eliquis 5 MG Tabs tablet Generic drug: apixaban SMARTSIG:1 Tablet(s) By Mouth Every 12 Hours   gabapentin 300 MG capsule Commonly known as: NEURONTIN Take 300 mg by mouth at bedtime.   levothyroxine 125 MCG tablet Commonly known as: SYNTHROID Take by mouth. What changed: Another medication with the same name was removed. Continue taking this medication, and follow the directions you see here. Changed by: Debroah Loop, PA-C   losartan-hydrochlorothiazide 100-25 MG tablet Commonly known as: HYZAAR Take 1 tablet by mouth daily.   lovastatin 40 MG tablet Commonly known as: MEVACOR TAKE 1 TABLET BY MOUTH EVERY DAY WITH DINNER   mirabegron ER 50 MG Tb24 tablet Commonly known as: MYRBETRIQ Take 1 tablet (50 mg total) by mouth daily. Started by: Debroah Loop, PA-C   Multi-Vitamin tablet Take 1 tablet by mouth daily.   omeprazole 20 MG capsule Commonly known as: PRILOSEC Take 20 mg by mouth 2 (two) times daily.   prednisoLONE acetate 1 % ophthalmic suspension Commonly known as: PRED FORTE Apply to eye.   timolol 0.5 % ophthalmic solution Commonly known as: TIMOPTIC Place 1 drop into both eyes 2 (two) times daily.        Allergies:  Allergies  Allergen Reactions  Penicillins Rash    Other reaction(s): Unknown    Sulfa Antibiotics Rash    Other reaction(s): Unknown     Family History: Family History  Problem Relation Age of Onset   Breast cancer Maternal Aunt     Social History:   reports that she has never smoked. She has never used smokeless tobacco. No history on file for alcohol use and drug use.  Physical Exam: BP 129/69   Pulse (!) 55   Ht '5\' 7"'$  (1.702 m)   Wt 184 lb (83.5 kg)   BMI 28.82 kg/m   Constitutional:  Alert and oriented, no acute distress, nontoxic appearing HEENT: [place], AT Cardiovascular: No clubbing, cyanosis, or edema Respiratory: Normal respiratory effort, no increased work of  breathing Skin: No rashes, bruises or suspicious lesions Neurologic: Grossly intact, no focal deficits, moving all 4 extremities Psychiatric: Normal mood and affect  Laboratory Data: Results for orders placed or performed in visit on 04/29/22  BLADDER SCAN AMB NON-IMAGING  Result Value Ref Range   Scan Result 27m    Assessment & Plan:   1. Urge incontinence of urine Symptoms stable on oxybutynin, though she continues to have dry mouth.  We discussed consideration of switching from anticholinergics to beta 3 agonists to reduce her risk for cognitive decline associated with long-term use of anticholinergic drugs.  She is open to this, so I am giving her 1 month of Myrbetriq 50 mg samples today and will plan to recheck her symptoms in 1 month. - BLADDER SCAN AMB NON-IMAGING - mirabegron ER (MYRBETRIQ) 50 MG TB24 tablet; Take 1 tablet (50 mg total) by mouth daily.  Dispense: 28 tablet; Refill: 0  2. Nephrolithiasis Asymptomatic, will continue to monitor.  Return in about 4 weeks (around 05/27/2022) for Symptom recheck with PVR.  SDebroah Loop PA-C  [facility] [address] [place] [phone]

## 2022-05-27 ENCOUNTER — Ambulatory Visit: Payer: Medicare HMO | Admitting: Physician Assistant

## 2022-05-27 ENCOUNTER — Encounter: Payer: Self-pay | Admitting: Physician Assistant

## 2022-05-27 VITALS — Ht 67.0 in | Wt 184.0 lb

## 2022-05-27 DIAGNOSIS — N3941 Urge incontinence: Secondary | ICD-10-CM

## 2022-05-27 LAB — BLADDER SCAN AMB NON-IMAGING: Scan Result: 65

## 2022-05-27 MED ORDER — MIRABEGRON ER 50 MG PO TB24: 50.0000 mg | ORAL_TABLET | Freq: Every day | ORAL | 11 refills | Status: DC

## 2022-05-27 NOTE — Progress Notes (Signed)
05/27/2022 1:43 PM   Summer Ramirez [birth year] [id number]  CC: Chief Complaint  Patient presents with   Urinary Incontinence   HPI: Summer Ramirez is a 71 y.o. female with PMH nephrolithiasis and OAB wet previously on oxybutynin XL 10 mg daily with reported dry mouth who presents today for symptom recheck on Myrbetriq 50 mg daily.   Today she reports her urgency, frequency, and urge incontinence are stably improved on Myrbetriq compared to oxybutynin.  The greatest improvement has been in her dry mouth, which has resolved.  She wishes to continue with Myrbetriq for that reason.  PVR 65 mL.  PMH: Past Medical History:  Diagnosis Date   Diverticulosis    GERD (gastroesophageal reflux disease)    Hyperlipidemia    Hypertension    Hypothyroid    Thyroid disease     Surgical History: Past Surgical History:  Procedure Laterality Date   colonoscopy     ESOPHAGOGASTRODUODENOSCOPY     EYE SURGERY     TUBAL LIGATION      Home Medications:  Allergies as of 05/27/2022       Reactions   Penicillins Rash   Other reaction(s): Unknown   Sulfa Antibiotics Rash   Other reaction(s): Unknown        Medication List        Accurate as of May 27, 2022  1:43 PM. If you have any questions, ask your nurse or doctor.          acetaminophen 500 MG tablet Commonly known as: TYLENOL Take by mouth.   ALPRAZolam 0.25 MG tablet Commonly known as: XANAX Take by mouth.   citalopram 20 MG tablet Commonly known as: CELEXA Take 20 mg by mouth daily.   Eliquis 5 MG Tabs tablet Generic drug: apixaban SMARTSIG:1 Tablet(s) By Mouth Every 12 Hours   gabapentin 300 MG capsule Commonly known as: NEURONTIN Take 300 mg by mouth at bedtime.   levothyroxine 125 MCG tablet Commonly known as: SYNTHROID Take by mouth.   losartan-hydrochlorothiazide 100-25 MG tablet Commonly known as: HYZAAR Take 1 tablet by mouth daily.   lovastatin 40 MG tablet Commonly known as:  MEVACOR TAKE 1 TABLET BY MOUTH EVERY DAY WITH DINNER   mirabegron ER 50 MG Tb24 tablet Commonly known as: MYRBETRIQ Take 1 tablet (50 mg total) by mouth daily.   Multi-Vitamin tablet Take 1 tablet by mouth daily.   omeprazole 20 MG capsule Commonly known as: PRILOSEC Take 20 mg by mouth 2 (two) times daily.   prednisoLONE acetate 1 % ophthalmic suspension Commonly known as: PRED FORTE Apply to eye.   timolol 0.5 % ophthalmic solution Commonly known as: TIMOPTIC Place 1 drop into both eyes 2 (two) times daily.        Allergies:  Allergies  Allergen Reactions   Penicillins Rash    Other reaction(s): Unknown    Sulfa Antibiotics Rash    Other reaction(s): Unknown     Family History: Family History  Problem Relation Age of Onset   Breast cancer Maternal Aunt     Social History:   reports that she has never smoked. She has never used smokeless tobacco. No history on file for alcohol use and drug use.  Physical Exam: Ht '5\' 7"'$  (1.702 m)   Wt 184 lb (83.5 kg)   BMI 28.82 kg/m   Constitutional:  Alert and oriented, no acute distress, nontoxic appearing HEENT: [place], AT Cardiovascular: No clubbing, cyanosis, or edema Respiratory: Normal respiratory effort, no increased  work of breathing Skin: No rashes, bruises or suspicious lesions Neurologic: Grossly intact, no focal deficits, moving all 4 extremities Psychiatric: Normal mood and affect  Laboratory Data: Results for orders placed or performed in visit on 05/27/22  Bladder Scan (Post Void Residual) in office  Result Value Ref Range   Scan Result 65    Assessment & Plan:   1. Urge incontinence of urine Equivalent improvement on Myrbetriq compared to oxybutynin, however with less dry mouth on Myrbetriq.  We will continue this and plan for symptom recheck in a year. - Bladder Scan (Post Void Residual) in office - mirabegron ER (MYRBETRIQ) 50 MG TB24 tablet; Take 1 tablet (50 mg total) by mouth daily.  Dispense:  30 tablet; Refill: 11  Return in about 1 year (around 05/28/2023) for Annual OAB f/u with PVR.  Debroah Loop, PA-C  [facility] [address] [place], [place] [address] [phone]

## 2022-07-13 DIAGNOSIS — H47323 Drusen of optic disc, bilateral: Secondary | ICD-10-CM | POA: Diagnosis not present

## 2022-07-13 DIAGNOSIS — H35363 Drusen (degenerative) of macula, bilateral: Secondary | ICD-10-CM | POA: Diagnosis not present

## 2022-07-13 DIAGNOSIS — H4051X3 Glaucoma secondary to other eye disorders, right eye, severe stage: Secondary | ICD-10-CM | POA: Diagnosis not present

## 2022-08-16 DIAGNOSIS — N83292 Other ovarian cyst, left side: Secondary | ICD-10-CM | POA: Diagnosis not present

## 2022-08-16 DIAGNOSIS — N83202 Unspecified ovarian cyst, left side: Secondary | ICD-10-CM | POA: Diagnosis not present

## 2022-08-24 ENCOUNTER — Other Ambulatory Visit: Payer: Self-pay | Admitting: Physician Assistant

## 2022-08-25 MED ORDER — OXYBUTYNIN CHLORIDE ER 10 MG PO TB24: 10.0000 mg | ORAL_TABLET | Freq: Every day | ORAL | 11 refills | Status: DC

## 2022-08-25 NOTE — Telephone Encounter (Signed)
Pt wanted to stay with oxybutynin,  rx sent to pharmacy by e-script

## 2022-09-09 DIAGNOSIS — E039 Hypothyroidism, unspecified: Secondary | ICD-10-CM | POA: Diagnosis not present

## 2022-09-09 DIAGNOSIS — R7302 Impaired glucose tolerance (oral): Secondary | ICD-10-CM | POA: Diagnosis not present

## 2022-09-16 DIAGNOSIS — R7302 Impaired glucose tolerance (oral): Secondary | ICD-10-CM | POA: Diagnosis not present

## 2022-09-16 DIAGNOSIS — M25562 Pain in left knee: Secondary | ICD-10-CM | POA: Diagnosis not present

## 2022-09-16 DIAGNOSIS — M25561 Pain in right knee: Secondary | ICD-10-CM | POA: Diagnosis not present

## 2022-09-16 DIAGNOSIS — I480 Paroxysmal atrial fibrillation: Secondary | ICD-10-CM | POA: Diagnosis not present

## 2022-09-16 DIAGNOSIS — I10 Essential (primary) hypertension: Secondary | ICD-10-CM | POA: Diagnosis not present

## 2022-09-16 DIAGNOSIS — E039 Hypothyroidism, unspecified: Secondary | ICD-10-CM | POA: Diagnosis not present

## 2022-09-16 DIAGNOSIS — K219 Gastro-esophageal reflux disease without esophagitis: Secondary | ICD-10-CM | POA: Diagnosis not present

## 2022-09-16 DIAGNOSIS — E785 Hyperlipidemia, unspecified: Secondary | ICD-10-CM | POA: Diagnosis not present

## 2022-09-21 DIAGNOSIS — M170 Bilateral primary osteoarthritis of knee: Secondary | ICD-10-CM | POA: Diagnosis not present

## 2022-10-18 DIAGNOSIS — I361 Nonrheumatic tricuspid (valve) insufficiency: Secondary | ICD-10-CM | POA: Diagnosis not present

## 2022-10-18 DIAGNOSIS — I480 Paroxysmal atrial fibrillation: Secondary | ICD-10-CM | POA: Diagnosis not present

## 2022-10-18 DIAGNOSIS — I10 Essential (primary) hypertension: Secondary | ICD-10-CM | POA: Diagnosis not present

## 2022-10-18 DIAGNOSIS — E785 Hyperlipidemia, unspecified: Secondary | ICD-10-CM | POA: Diagnosis not present

## 2022-10-18 DIAGNOSIS — I340 Nonrheumatic mitral (valve) insufficiency: Secondary | ICD-10-CM | POA: Diagnosis not present

## 2022-11-16 DIAGNOSIS — H4051X3 Glaucoma secondary to other eye disorders, right eye, severe stage: Secondary | ICD-10-CM | POA: Diagnosis not present

## 2022-11-24 ENCOUNTER — Other Ambulatory Visit: Payer: Self-pay | Admitting: Internal Medicine

## 2022-11-24 DIAGNOSIS — Z1231 Encounter for screening mammogram for malignant neoplasm of breast: Secondary | ICD-10-CM

## 2022-12-01 DIAGNOSIS — J209 Acute bronchitis, unspecified: Secondary | ICD-10-CM | POA: Diagnosis not present

## 2022-12-29 ENCOUNTER — Ambulatory Visit
Admission: RE | Admit: 2022-12-29 | Discharge: 2022-12-29 | Disposition: A | Discharge status: Home / Self Care | Payer: Medicare HMO | Source: Ambulatory Visit | Attending: Internal Medicine | Admitting: Internal Medicine

## 2022-12-29 DIAGNOSIS — Z1231 Encounter for screening mammogram for malignant neoplasm of breast: Secondary | ICD-10-CM | POA: Diagnosis not present

## 2023-02-22 DIAGNOSIS — R1013 Epigastric pain: Secondary | ICD-10-CM | POA: Diagnosis not present

## 2023-02-22 DIAGNOSIS — R1319 Other dysphagia: Secondary | ICD-10-CM | POA: Diagnosis not present

## 2023-02-22 DIAGNOSIS — K219 Gastro-esophageal reflux disease without esophagitis: Secondary | ICD-10-CM | POA: Diagnosis not present

## 2023-02-22 DIAGNOSIS — Z8719 Personal history of other diseases of the digestive system: Secondary | ICD-10-CM | POA: Diagnosis not present

## 2023-02-22 DIAGNOSIS — R1012 Left upper quadrant pain: Secondary | ICD-10-CM | POA: Diagnosis not present

## 2023-02-22 DIAGNOSIS — Z7901 Long term (current) use of anticoagulants: Secondary | ICD-10-CM | POA: Diagnosis not present

## 2023-02-22 DIAGNOSIS — R1011 Right upper quadrant pain: Secondary | ICD-10-CM | POA: Diagnosis not present

## 2023-03-18 DIAGNOSIS — I10 Essential (primary) hypertension: Secondary | ICD-10-CM | POA: Diagnosis not present

## 2023-03-18 DIAGNOSIS — E039 Hypothyroidism, unspecified: Secondary | ICD-10-CM | POA: Diagnosis not present

## 2023-03-25 DIAGNOSIS — I480 Paroxysmal atrial fibrillation: Secondary | ICD-10-CM | POA: Diagnosis not present

## 2023-03-25 DIAGNOSIS — I10 Essential (primary) hypertension: Secondary | ICD-10-CM | POA: Diagnosis not present

## 2023-03-25 DIAGNOSIS — K219 Gastro-esophageal reflux disease without esophagitis: Secondary | ICD-10-CM | POA: Diagnosis not present

## 2023-03-25 DIAGNOSIS — E785 Hyperlipidemia, unspecified: Secondary | ICD-10-CM | POA: Diagnosis not present

## 2023-03-25 DIAGNOSIS — Z1331 Encounter for screening for depression: Secondary | ICD-10-CM | POA: Diagnosis not present

## 2023-03-25 DIAGNOSIS — Z0000 Encounter for general adult medical examination without abnormal findings: Secondary | ICD-10-CM | POA: Diagnosis not present

## 2023-03-25 DIAGNOSIS — F32A Depression, unspecified: Secondary | ICD-10-CM | POA: Diagnosis not present

## 2023-03-25 DIAGNOSIS — Z0001 Encounter for general adult medical examination with abnormal findings: Secondary | ICD-10-CM | POA: Diagnosis not present

## 2023-03-25 DIAGNOSIS — F324 Major depressive disorder, single episode, in partial remission: Secondary | ICD-10-CM | POA: Insufficient documentation

## 2023-03-25 DIAGNOSIS — E039 Hypothyroidism, unspecified: Secondary | ICD-10-CM | POA: Diagnosis not present

## 2023-03-30 DIAGNOSIS — H5213 Myopia, bilateral: Secondary | ICD-10-CM | POA: Diagnosis not present

## 2023-04-08 ENCOUNTER — Ambulatory Visit: Payer: Medicare HMO

## 2023-04-08 DIAGNOSIS — K222 Esophageal obstruction: Secondary | ICD-10-CM | POA: Diagnosis not present

## 2023-04-08 DIAGNOSIS — K219 Gastro-esophageal reflux disease without esophagitis: Secondary | ICD-10-CM | POA: Diagnosis not present

## 2023-04-08 DIAGNOSIS — R1319 Other dysphagia: Secondary | ICD-10-CM | POA: Diagnosis not present

## 2023-04-08 DIAGNOSIS — K449 Diaphragmatic hernia without obstruction or gangrene: Secondary | ICD-10-CM | POA: Diagnosis not present

## 2023-04-08 DIAGNOSIS — R1013 Epigastric pain: Secondary | ICD-10-CM | POA: Diagnosis not present

## 2023-04-08 DIAGNOSIS — R1011 Right upper quadrant pain: Secondary | ICD-10-CM | POA: Diagnosis not present

## 2023-04-08 DIAGNOSIS — Z8719 Personal history of other diseases of the digestive system: Secondary | ICD-10-CM | POA: Diagnosis not present

## 2023-04-08 DIAGNOSIS — R1012 Left upper quadrant pain: Secondary | ICD-10-CM | POA: Diagnosis not present

## 2023-04-18 DIAGNOSIS — I480 Paroxysmal atrial fibrillation: Secondary | ICD-10-CM | POA: Diagnosis not present

## 2023-04-18 DIAGNOSIS — I10 Essential (primary) hypertension: Secondary | ICD-10-CM | POA: Diagnosis not present

## 2023-04-18 DIAGNOSIS — I361 Nonrheumatic tricuspid (valve) insufficiency: Secondary | ICD-10-CM | POA: Diagnosis not present

## 2023-04-18 DIAGNOSIS — I340 Nonrheumatic mitral (valve) insufficiency: Secondary | ICD-10-CM | POA: Diagnosis not present

## 2023-04-18 DIAGNOSIS — E785 Hyperlipidemia, unspecified: Secondary | ICD-10-CM | POA: Diagnosis not present

## 2023-05-24 DIAGNOSIS — H4051X3 Glaucoma secondary to other eye disorders, right eye, severe stage: Secondary | ICD-10-CM | POA: Diagnosis not present

## 2023-05-24 DIAGNOSIS — H35363 Drusen (degenerative) of macula, bilateral: Secondary | ICD-10-CM | POA: Diagnosis not present

## 2023-05-24 DIAGNOSIS — H47323 Drusen of optic disc, bilateral: Secondary | ICD-10-CM | POA: Diagnosis not present

## 2023-05-25 ENCOUNTER — Ambulatory Visit: Payer: Medicare HMO | Admitting: Physician Assistant

## 2023-05-25 VITALS — BP 112/74 | HR 60

## 2023-05-25 DIAGNOSIS — N3941 Urge incontinence: Secondary | ICD-10-CM

## 2023-05-25 LAB — BLADDER SCAN AMB NON-IMAGING: Scan Result: 23

## 2023-05-25 MED ORDER — MIRABEGRON ER 50 MG PO TB24
50.0000 mg | ORAL_TABLET | Freq: Every day | ORAL | 11 refills | Status: DC
Start: 2023-05-25 — End: 2024-04-30

## 2023-05-25 NOTE — Progress Notes (Signed)
05/25/2023 11:03 AM   Danton Clap 05-05-1951 [id number]  CC: Chief Complaint  Patient presents with   Urinary Incontinence   HPI: Summer Ramirez is a 72 y.o. female with PMH nephrolithiasis and OAB wet on oxybutynin XL 10 mg who presents today for annual follow-up.   We have previously switched her to Myrbetriq 50 mg and she had resolution of dry mouth on this agent, but she elected to switch back to oxybutynin several months later due to cost concerns.  Today she reports overall she is pleased with her bladder management.  She still has dry mouth on the oxybutynin, though it is better than it was before.  She reports daytime frequency x 2-3, nocturia x 1, and urge incontinence x 2-3 with some nocturnal enuresis.  PVR 23mL.  PMH: Past Medical History:  Diagnosis Date   Diverticulosis    GERD (gastroesophageal reflux disease)    Hyperlipidemia    Hypertension    Hypothyroid    Thyroid disease     Surgical History: Past Surgical History:  Procedure Laterality Date   colonoscopy     ESOPHAGOGASTRODUODENOSCOPY     EYE SURGERY     TUBAL LIGATION      Home Medications:  Allergies as of 05/25/2023       Reactions   Penicillins Rash   Other reaction(s): Unknown   Sulfa Antibiotics Rash   Other reaction(s): Unknown        Medication List        Accurate as of May 25, 2023 11:03 AM. If you have any questions, ask your nurse or doctor.          acetaminophen 500 MG tablet Commonly known as: TYLENOL Take by mouth.   ALPRAZolam 0.25 MG tablet Commonly known as: XANAX Take by mouth.   citalopram 20 MG tablet Commonly known as: CELEXA Take 20 mg by mouth daily.   Eliquis 5 MG Tabs tablet Generic drug: apixaban SMARTSIG:1 Tablet(s) By Mouth Every 12 Hours   gabapentin 300 MG capsule Commonly known as: NEURONTIN Take 300 mg by mouth at bedtime.   levothyroxine 125 MCG tablet Commonly known as: SYNTHROID Take by mouth.    losartan-hydrochlorothiazide 100-25 MG tablet Commonly known as: HYZAAR Take 1 tablet by mouth daily.   lovastatin 40 MG tablet Commonly known as: MEVACOR TAKE 1 TABLET BY MOUTH EVERY DAY WITH DINNER   Multi-Vitamin tablet Take 1 tablet by mouth daily.   omeprazole 20 MG capsule Commonly known as: PRILOSEC Take 20 mg by mouth 2 (two) times daily.   oxybutynin 10 MG 24 hr tablet Commonly known as: DITROPAN-XL Take 1 tablet (10 mg total) by mouth daily.   prednisoLONE acetate 1 % ophthalmic suspension Commonly known as: PRED FORTE Apply to eye.   timolol 0.5 % ophthalmic solution Commonly known as: TIMOPTIC Place 1 drop into both eyes 2 (two) times daily.        Allergies:  Allergies  Allergen Reactions   Penicillins Rash    Other reaction(s): Unknown    Sulfa Antibiotics Rash    Other reaction(s): Unknown     Family History: Family History  Problem Relation Age of Onset   Breast cancer Maternal Aunt     Social History:   reports that she has never smoked. She has never used smokeless tobacco. No history on file for alcohol use and drug use.  Physical Exam: BP 112/74   Pulse 60   Constitutional:  Alert and oriented, no acute  distress, nontoxic appearing HEENT: [place], AT Cardiovascular: No clubbing, cyanosis, or edema Respiratory: Normal respiratory effort, no increased work of breathing Skin: No rashes, bruises or suspicious lesions Neurologic: Grossly intact, no focal deficits, moving all 4 extremities Psychiatric: Normal mood and affect  Laboratory Data: Results for orders placed or performed in visit on 05/25/23  BLADDER SCAN AMB NON-IMAGING  Result Value Ref Range   Scan Result 23 ml    Assessment & Plan:   1. Urge incontinence of urine Well-managed on oxybutynin XL 10 mg daily, though with some dry mouth due to anticholinergic side effects.  She previously did well on Myrbetriq, though switch back to oxybutynin due to cost concerns.  We  discussed options including continuing oxybutynin versus reattempting Myrbetriq now that it is available generic and in case her insurance coverage for this med has changed in the new year.  We discussed long-term risks of oxybutynin including cognitive decline and increased risk for dementia.  She is in agreement with reattempting mirabegron 50 mg daily, prescription sent in today.  If this remains cost prohibitive, we will keep her on oxybutynin. - BLADDER SCAN AMB NON-IMAGING - mirabegron ER (MYRBETRIQ) 50 MG TB24 tablet; Take 1 tablet (50 mg total) by mouth daily.  Dispense: 30 tablet; Refill: 11   Return in about 1 year (around 05/24/2024) for Annual OAB f/u with PVR.  Carman Ching, PA-C  [facility] [place] [address] [place], [place] [address] [phone]

## 2023-06-01 DIAGNOSIS — N83202 Unspecified ovarian cyst, left side: Secondary | ICD-10-CM | POA: Diagnosis not present

## 2023-06-07 DIAGNOSIS — M170 Bilateral primary osteoarthritis of knee: Secondary | ICD-10-CM | POA: Diagnosis not present

## 2023-06-28 DIAGNOSIS — U071 COVID-19: Secondary | ICD-10-CM | POA: Diagnosis not present

## 2023-06-28 DIAGNOSIS — J069 Acute upper respiratory infection, unspecified: Secondary | ICD-10-CM | POA: Diagnosis not present

## 2023-09-21 DIAGNOSIS — R7302 Impaired glucose tolerance (oral): Secondary | ICD-10-CM | POA: Diagnosis not present

## 2023-09-21 DIAGNOSIS — E039 Hypothyroidism, unspecified: Secondary | ICD-10-CM | POA: Diagnosis not present

## 2023-09-29 DIAGNOSIS — F324 Major depressive disorder, single episode, in partial remission: Secondary | ICD-10-CM | POA: Diagnosis not present

## 2023-09-29 DIAGNOSIS — I480 Paroxysmal atrial fibrillation: Secondary | ICD-10-CM | POA: Diagnosis not present

## 2023-09-29 DIAGNOSIS — E785 Hyperlipidemia, unspecified: Secondary | ICD-10-CM | POA: Diagnosis not present

## 2023-09-29 DIAGNOSIS — E039 Hypothyroidism, unspecified: Secondary | ICD-10-CM | POA: Diagnosis not present

## 2023-09-29 DIAGNOSIS — R7302 Impaired glucose tolerance (oral): Secondary | ICD-10-CM | POA: Diagnosis not present

## 2023-09-29 DIAGNOSIS — I10 Essential (primary) hypertension: Secondary | ICD-10-CM | POA: Diagnosis not present

## 2023-09-29 DIAGNOSIS — K219 Gastro-esophageal reflux disease without esophagitis: Secondary | ICD-10-CM | POA: Diagnosis not present

## 2023-10-11 DIAGNOSIS — R051 Acute cough: Secondary | ICD-10-CM | POA: Diagnosis not present

## 2023-10-11 DIAGNOSIS — Z03818 Encounter for observation for suspected exposure to other biological agents ruled out: Secondary | ICD-10-CM | POA: Diagnosis not present

## 2023-10-13 DIAGNOSIS — K219 Gastro-esophageal reflux disease without esophagitis: Secondary | ICD-10-CM | POA: Diagnosis not present

## 2023-10-13 DIAGNOSIS — R1013 Epigastric pain: Secondary | ICD-10-CM | POA: Diagnosis not present

## 2023-10-13 DIAGNOSIS — Z8719 Personal history of other diseases of the digestive system: Secondary | ICD-10-CM | POA: Diagnosis not present

## 2023-10-25 DIAGNOSIS — J069 Acute upper respiratory infection, unspecified: Secondary | ICD-10-CM | POA: Diagnosis not present

## 2023-11-04 ENCOUNTER — Other Ambulatory Visit: Payer: Self-pay | Admitting: Physician Assistant

## 2023-11-08 DIAGNOSIS — I480 Paroxysmal atrial fibrillation: Secondary | ICD-10-CM | POA: Diagnosis not present

## 2023-11-08 DIAGNOSIS — E785 Hyperlipidemia, unspecified: Secondary | ICD-10-CM | POA: Diagnosis not present

## 2023-11-08 DIAGNOSIS — I361 Nonrheumatic tricuspid (valve) insufficiency: Secondary | ICD-10-CM | POA: Diagnosis not present

## 2023-11-08 DIAGNOSIS — I340 Nonrheumatic mitral (valve) insufficiency: Secondary | ICD-10-CM | POA: Diagnosis not present

## 2023-11-08 DIAGNOSIS — I10 Essential (primary) hypertension: Secondary | ICD-10-CM | POA: Diagnosis not present

## 2023-12-02 ENCOUNTER — Other Ambulatory Visit: Payer: Self-pay | Admitting: Obstetrics and Gynecology

## 2023-12-02 DIAGNOSIS — Z1231 Encounter for screening mammogram for malignant neoplasm of breast: Secondary | ICD-10-CM

## 2023-12-13 DIAGNOSIS — H47323 Drusen of optic disc, bilateral: Secondary | ICD-10-CM | POA: Diagnosis not present

## 2023-12-13 DIAGNOSIS — H4051X3 Glaucoma secondary to other eye disorders, right eye, severe stage: Secondary | ICD-10-CM | POA: Diagnosis not present

## 2023-12-13 DIAGNOSIS — H35363 Drusen (degenerative) of macula, bilateral: Secondary | ICD-10-CM | POA: Diagnosis not present

## 2024-01-02 ENCOUNTER — Ambulatory Visit
Admission: RE | Admit: 2024-01-02 | Discharge: 2024-01-02 | Disposition: A | Discharge status: Home / Self Care | Payer: Medicare HMO | Source: Ambulatory Visit | Attending: Obstetrics and Gynecology | Admitting: Obstetrics and Gynecology

## 2024-01-02 DIAGNOSIS — Z1231 Encounter for screening mammogram for malignant neoplasm of breast: Secondary | ICD-10-CM | POA: Insufficient documentation

## 2024-03-21 DIAGNOSIS — M170 Bilateral primary osteoarthritis of knee: Secondary | ICD-10-CM | POA: Diagnosis not present

## 2024-03-27 DIAGNOSIS — E039 Hypothyroidism, unspecified: Secondary | ICD-10-CM | POA: Diagnosis not present

## 2024-03-27 DIAGNOSIS — R7302 Impaired glucose tolerance (oral): Secondary | ICD-10-CM | POA: Diagnosis not present

## 2024-03-27 DIAGNOSIS — I10 Essential (primary) hypertension: Secondary | ICD-10-CM | POA: Diagnosis not present

## 2024-04-03 DIAGNOSIS — Z0001 Encounter for general adult medical examination with abnormal findings: Secondary | ICD-10-CM | POA: Diagnosis not present

## 2024-04-03 DIAGNOSIS — Z1331 Encounter for screening for depression: Secondary | ICD-10-CM | POA: Diagnosis not present

## 2024-04-03 DIAGNOSIS — F324 Major depressive disorder, single episode, in partial remission: Secondary | ICD-10-CM | POA: Diagnosis not present

## 2024-04-03 DIAGNOSIS — R7302 Impaired glucose tolerance (oral): Secondary | ICD-10-CM | POA: Diagnosis not present

## 2024-04-03 DIAGNOSIS — I10 Essential (primary) hypertension: Secondary | ICD-10-CM | POA: Diagnosis not present

## 2024-04-03 DIAGNOSIS — Z0000 Encounter for general adult medical examination without abnormal findings: Secondary | ICD-10-CM | POA: Diagnosis not present

## 2024-04-03 DIAGNOSIS — E785 Hyperlipidemia, unspecified: Secondary | ICD-10-CM | POA: Diagnosis not present

## 2024-04-03 DIAGNOSIS — I480 Paroxysmal atrial fibrillation: Secondary | ICD-10-CM | POA: Diagnosis not present

## 2024-04-03 DIAGNOSIS — E039 Hypothyroidism, unspecified: Secondary | ICD-10-CM | POA: Diagnosis not present

## 2024-04-29 ENCOUNTER — Other Ambulatory Visit: Payer: Self-pay | Admitting: Physician Assistant

## 2024-04-29 DIAGNOSIS — N3941 Urge incontinence: Secondary | ICD-10-CM

## 2024-05-23 ENCOUNTER — Ambulatory Visit (INDEPENDENT_AMBULATORY_CARE_PROVIDER_SITE_OTHER): Payer: Self-pay | Admitting: Physician Assistant

## 2024-05-23 VITALS — BP 122/77 | HR 89 | Ht 67.0 in | Wt 195.0 lb

## 2024-05-23 DIAGNOSIS — N83202 Unspecified ovarian cyst, left side: Secondary | ICD-10-CM | POA: Insufficient documentation

## 2024-05-23 DIAGNOSIS — N3941 Urge incontinence: Secondary | ICD-10-CM

## 2024-05-23 LAB — BLADDER SCAN AMB NON-IMAGING: Scan Result: 12

## 2024-05-23 MED ORDER — MIRABEGRON ER 50 MG PO TB24: 50.0000 mg | ORAL_TABLET | Freq: Every day | ORAL | 3 refills | Status: AC

## 2024-05-23 NOTE — Progress Notes (Signed)
 05/23/2024 11:05 AM   Summer Ramirez [birth year] [id number]  CC: Chief Complaint  Patient presents with   Urinary Incontinence   HPI: Summer Ramirez is a 73 y.o. female with PMH nephrolithiasis and OAB wet on Myrbetriq  50 mg who presents today for annual follow-up.   Today she reports her symptoms are well-controlled on Myrbetriq  she is able to afford it at $45 per month.  She has no acute concerns today.  No gross hematuria or dysuria.  PVR 12mL.  PMH: Past Medical History:  Diagnosis Date   Diverticulosis    GERD (gastroesophageal reflux disease)    Hyperlipidemia    Hypertension    Hypothyroid    Thyroid  disease     Surgical History: Past Surgical History:  Procedure Laterality Date   colonoscopy     ESOPHAGOGASTRODUODENOSCOPY     EYE SURGERY     TUBAL LIGATION      Home Medications:  Allergies as of 05/23/2024       Reactions   Penicillins Rash   Other reaction(s): Unknown   Sulfa Antibiotics Rash   Other reaction(s): Unknown        Medication List        Accurate as of May 23, 2024 11:05 AM. If you have any questions, ask your nurse or doctor.          acetaminophen 500 MG tablet Commonly known as: TYLENOL Take by mouth.   ALPRAZolam 0.25 MG tablet Commonly known as: XANAX Take by mouth.   citalopram 20 MG tablet Commonly known as: CELEXA Take 20 mg by mouth daily.   Eliquis 5 MG Tabs tablet Generic drug: apixaban SMARTSIG:1 Tablet(s) By Mouth Every 12 Hours   gabapentin 300 MG capsule Commonly known as: NEURONTIN Take 300 mg by mouth at bedtime.   levothyroxine 125 MCG tablet Commonly known as: SYNTHROID Take by mouth.   losartan-hydrochlorothiazide 100-25 MG tablet Commonly known as: HYZAAR Take 1 tablet by mouth daily.   lovastatin 40 MG tablet Commonly known as: MEVACOR TAKE 1 TABLET BY MOUTH EVERY DAY WITH DINNER   Multi-Vitamin tablet Take 1 tablet by mouth daily.   Myrbetriq  50 MG Tb24 tablet Generic drug:  mirabegron  ER TAKE 1 TABLET BY MOUTH EVERY DAY   omeprazole 20 MG capsule Commonly known as: PRILOSEC Take 20 mg by mouth 2 (two) times daily.   prednisoLONE acetate 1 % ophthalmic suspension Commonly known as: PRED FORTE Apply to eye.   timolol 0.5 % ophthalmic solution Commonly known as: TIMOPTIC Place 1 drop into both eyes 2 (two) times daily.        Allergies:  Allergies  Allergen Reactions   Penicillins Rash    Other reaction(s): Unknown    Sulfa Antibiotics Rash    Other reaction(s): Unknown     Family History: Family History  Problem Relation Age of Onset   Breast cancer Maternal Aunt     Social History:   reports that she has never smoked. She has never used smokeless tobacco. No history on file for alcohol  use and drug use.  Physical Exam: There were no vitals taken for this visit.  Constitutional:  Alert and oriented, no acute distress, nontoxic appearing HEENT: [place], AT Cardiovascular: No clubbing, cyanosis, or edema Respiratory: Normal respiratory effort, no increased work of breathing Skin: No rashes, bruises or suspicious lesions Neurologic: Grossly intact, no focal deficits, moving Ramirez 4 extremities Psychiatric: Normal mood and affect  Laboratory Data: Results for orders placed or performed in  visit on 05/23/24  BLADDER SCAN AMB NON-IMAGING   Collection Time: 05/23/24 11:06 AM  Result Value Ref Range   Scan Result 12 ml    Assessment & Plan:   1. Urge incontinence of urine (Primary) Controlled on Myrbetriq  50 mg, will plan to continue this.  Will schedule her for annual follow-up, though we discussed that if she wants to have her PCP prescribe this, that would also be appropriate. - BLADDER SCAN AMB NON-IMAGING - mirabegron  ER (MYRBETRIQ ) 50 MG TB24 tablet; Take 1 tablet (50 mg total) by mouth daily.  Dispense: 90 tablet; Refill: 3   Return in about 1 year (around 05/23/2025) for Annual OAB f/u with PVR.  Lucie Hones, PA-C  [facility] [place] [address] [place], [place] [address] [phone]

## 2024-05-29 DIAGNOSIS — I10 Essential (primary) hypertension: Secondary | ICD-10-CM | POA: Diagnosis not present

## 2024-05-29 DIAGNOSIS — E785 Hyperlipidemia, unspecified: Secondary | ICD-10-CM | POA: Diagnosis not present

## 2024-05-29 DIAGNOSIS — I361 Nonrheumatic tricuspid (valve) insufficiency: Secondary | ICD-10-CM | POA: Diagnosis not present

## 2024-05-29 DIAGNOSIS — I480 Paroxysmal atrial fibrillation: Secondary | ICD-10-CM | POA: Diagnosis not present

## 2024-05-29 DIAGNOSIS — I340 Nonrheumatic mitral (valve) insufficiency: Secondary | ICD-10-CM | POA: Diagnosis not present

## 2024-05-31 DIAGNOSIS — N762 Acute vulvitis: Secondary | ICD-10-CM | POA: Diagnosis not present

## 2024-05-31 DIAGNOSIS — Z1331 Encounter for screening for depression: Secondary | ICD-10-CM | POA: Diagnosis not present

## 2024-05-31 DIAGNOSIS — L03115 Cellulitis of right lower limb: Secondary | ICD-10-CM | POA: Diagnosis not present

## 2024-05-31 DIAGNOSIS — Z124 Encounter for screening for malignant neoplasm of cervix: Secondary | ICD-10-CM | POA: Diagnosis not present

## 2024-06-12 DIAGNOSIS — H47323 Drusen of optic disc, bilateral: Secondary | ICD-10-CM | POA: Diagnosis not present

## 2024-06-12 DIAGNOSIS — H35363 Drusen (degenerative) of macula, bilateral: Secondary | ICD-10-CM | POA: Diagnosis not present

## 2024-06-12 DIAGNOSIS — H4051X3 Glaucoma secondary to other eye disorders, right eye, severe stage: Secondary | ICD-10-CM | POA: Diagnosis not present

## 2024-06-14 DIAGNOSIS — Z1211 Encounter for screening for malignant neoplasm of colon: Secondary | ICD-10-CM | POA: Diagnosis not present

## 2024-06-26 DIAGNOSIS — H524 Presbyopia: Secondary | ICD-10-CM | POA: Diagnosis not present

## 2024-08-03 DIAGNOSIS — H353132 Nonexudative age-related macular degeneration, bilateral, intermediate dry stage: Secondary | ICD-10-CM | POA: Diagnosis not present

## 2024-08-03 DIAGNOSIS — H47323 Drusen of optic disc, bilateral: Secondary | ICD-10-CM | POA: Diagnosis not present

## 2024-08-18 DIAGNOSIS — S22000A Wedge compression fracture of unspecified thoracic vertebra, initial encounter for closed fracture: Secondary | ICD-10-CM | POA: Diagnosis not present

## 2024-08-18 DIAGNOSIS — M545 Low back pain, unspecified: Secondary | ICD-10-CM | POA: Diagnosis not present

## 2024-08-18 DIAGNOSIS — M546 Pain in thoracic spine: Secondary | ICD-10-CM | POA: Diagnosis not present

## 2024-08-18 DIAGNOSIS — R0781 Pleurodynia: Secondary | ICD-10-CM | POA: Diagnosis not present

## 2024-08-18 DIAGNOSIS — W19XXXA Unspecified fall, initial encounter: Secondary | ICD-10-CM | POA: Diagnosis not present

## 2024-08-21 ENCOUNTER — Ambulatory Visit
Admission: RE | Admit: 2024-08-21 | Discharge: 2024-08-21 | Disposition: A | Discharge status: Home / Self Care | Source: Ambulatory Visit | Attending: Internal Medicine | Admitting: Internal Medicine

## 2024-08-21 ENCOUNTER — Other Ambulatory Visit: Payer: Self-pay | Admitting: Internal Medicine

## 2024-08-21 DIAGNOSIS — S22000D Wedge compression fracture of unspecified thoracic vertebra, subsequent encounter for fracture with routine healing: Secondary | ICD-10-CM | POA: Diagnosis not present

## 2024-08-21 DIAGNOSIS — S22000A Wedge compression fracture of unspecified thoracic vertebra, initial encounter for closed fracture: Secondary | ICD-10-CM

## 2024-08-21 DIAGNOSIS — M4187 Other forms of scoliosis, lumbosacral region: Secondary | ICD-10-CM | POA: Diagnosis not present

## 2024-08-21 DIAGNOSIS — M549 Dorsalgia, unspecified: Secondary | ICD-10-CM | POA: Diagnosis not present

## 2024-08-21 DIAGNOSIS — M4854XA Collapsed vertebra, not elsewhere classified, thoracic region, initial encounter for fracture: Secondary | ICD-10-CM | POA: Diagnosis not present

## 2024-08-27 ENCOUNTER — Inpatient Hospital Stay: Admission: RE | Admit: 2024-08-27 | Source: Ambulatory Visit

## 2024-08-27 ENCOUNTER — Other Ambulatory Visit: Payer: Self-pay | Admitting: Internal Medicine

## 2024-08-27 DIAGNOSIS — M4854XG Collapsed vertebra, not elsewhere classified, thoracic region, subsequent encounter for fracture with delayed healing: Secondary | ICD-10-CM

## 2024-08-28 ENCOUNTER — Inpatient Hospital Stay
Admission: RE | Admit: 2024-08-28 | Discharge: 2024-08-28 | Disposition: A | Source: Ambulatory Visit | Attending: Internal Medicine

## 2024-08-28 ENCOUNTER — Other Ambulatory Visit (HOSPITAL_COMMUNITY): Payer: Self-pay | Admitting: Interventional Radiology

## 2024-08-28 VITALS — BP 148/85 | HR 68 | Temp 98.2°F | Resp 16 | Wt 197.0 lb

## 2024-08-28 DIAGNOSIS — M8008XD Age-related osteoporosis with current pathological fracture, vertebra(e), subsequent encounter for fracture with routine healing: Secondary | ICD-10-CM | POA: Diagnosis not present

## 2024-08-28 DIAGNOSIS — I480 Paroxysmal atrial fibrillation: Secondary | ICD-10-CM | POA: Diagnosis not present

## 2024-08-28 DIAGNOSIS — I10 Essential (primary) hypertension: Secondary | ICD-10-CM | POA: Diagnosis not present

## 2024-08-28 DIAGNOSIS — R011 Cardiac murmur, unspecified: Secondary | ICD-10-CM

## 2024-08-28 DIAGNOSIS — M4854XG Collapsed vertebra, not elsewhere classified, thoracic region, subsequent encounter for fracture with delayed healing: Secondary | ICD-10-CM

## 2024-08-28 NOTE — Consult Note (Addendum)
 Chief Complaint: Patient was seen in consultation today for back pain at the request of Johnston,John D  Referring Physician(s): Rudolpho Norleen BIRCH  Supervising Physician: Karalee Beat  History of Present Illness: Summer Ramirez is a 73 y.o. female with past medical history significant for GERD, HTN, HLD, severe tricuspid regurgitation, paroxysmal a.fib on Eliquis who presents today to discuss treatment options for T8 compression fracture. [name] was walking her dog on 08/13/24 when her dog pulled her over while trying to chase something causing her to land on her chest and arms, particularly on her left side. Immediately after the fall she experienced diffuse back pain with some pain radiating to the front of her rib cage on both sides. She tried over the counter pain medications as well as hydrocodone with little relief so she presented to her PCP's office on 9/20 for further evaluation. She underwent a 3 view CXR as well as thoracic and lumbar x-rays which were notable for wedge compression fracture in the mid thoracic vertebrae with approximately 30-40% height loss. She was prescribed Percocet for pain management and referred to orthopedics.  She was seen by orthopedics (Dr. Rudolpho) on 9/23 and and MRI of the thoracic spine was obtained for further evaluation of the fracture. MRI showed:  Acute or subacute compression fracture of T8 with loss of height of 25% and minimal posterior bowing of the posterior margin of the T8 vertebral body but no significant encroachment upon the canal or foramina. No finding to suggest that this is anything other than a benign osteoporotic or traumatic fracture.  She has now been referred to IR for discuss possible T8 kyphoplasty for pain management.  Her pain is significant, she rates it a 9 out of 10.  Her pain is also debilitating, she scored a 24 out of 24 on the [employer] disability questionnaire. She said she is miserable and is  highly motivated to undergo treatment. She has known osteopenia from a DEXA done 10 years previously.   Past Medical History:  Diagnosis Date   Diverticulosis    GERD (gastroesophageal reflux disease)    Hyperlipidemia    Hypertension    Hypothyroid    Thyroid  disease     Past Surgical History:  Procedure Laterality Date   colonoscopy     ESOPHAGOGASTRODUODENOSCOPY     EYE SURGERY     TUBAL LIGATION      Allergies: Penicillins and Sulfa antibiotics  Medications: Prior to Admission medications   Medication Sig Start Date End Date Taking? Authorizing Provider  acetaminophen (TYLENOL) 500 MG tablet Take by mouth.    [provider]  ALPRAZolam (XANAX) 0.25 MG tablet Take by mouth.    [provider]  citalopram (CELEXA) 20 MG tablet Take 20 mg by mouth daily. 02/22/22   [provider]  ELIQUIS 5 MG TABS tablet SMARTSIG:1 Tablet(s) By Mouth Every 12 Hours 04/07/22   [provider]  gabapentin (NEURONTIN) 300 MG capsule Take 300 mg by mouth at bedtime. 02/12/22   [provider]  levothyroxine (SYNTHROID) 125 MCG tablet Take by mouth.    [provider]  losartan-hydrochlorothiazide (HYZAAR) 100-25 MG tablet Take 1 tablet by mouth daily. 02/09/22   [provider]  lovastatin (MEVACOR) 40 MG tablet TAKE 1 TABLET BY MOUTH EVERY DAY WITH DINNER 01/27/21   [provider]  mirabegron  ER (MYRBETRIQ ) 50 MG TB24 tablet Take 1 tablet (50 mg total) by mouth daily. 05/23/24   Vaillancourt, Samantha, PA-C  Multiple  Vitamin (MULTI-VITAMIN) tablet Take 1 tablet by mouth daily.    [provider]  omeprazole (PRILOSEC) 20 MG capsule Take 20 mg by mouth 2 (two) times daily. 03/02/22   [provider]  prednisoLONE acetate (PRED FORTE) 1 % ophthalmic suspension Apply to eye. 07/07/20   [provider]  timolol (TIMOPTIC) 0.5 % ophthalmic solution Place 1 drop into both eyes 2 (two) times daily. 03/09/21    [provider]     Family History  Problem Relation Age of Onset   Breast cancer Maternal Aunt     Social History   Socioeconomic History   Marital status: Married    Spouse name: Not on file   Number of children: Not on file   Years of education: Not on file   Highest education level: Not on file  Occupational History   Not on file  Tobacco Use   Smoking status: Never   Smokeless tobacco: Never  Substance and Sexual Activity   Alcohol  use: Not on file   Drug use: Not on file   Sexual activity: Not on file  Other Topics Concern   Not on file  Social History Narrative   Not on file   Social Drivers of Health   Financial Resource Strain: Low Risk  (05/31/2024)   [facility]   Overall Financial Resource Strain (CARDIA)    Difficulty of Paying Living Expenses: Not hard at all  Food Insecurity: No Food Insecurity (05/31/2024)   Received from [facility]   Hunger Vital Sign    Within the past 12 months, you worried that your food would run out before you got the money to buy more.: Never true    Within the past 12 months, the food you bought just didn't last and you didn't have money to get more.: Never true  Transportation Needs: No Transportation Needs (05/31/2024)   Received from [facility] - Transportation    In the past 12 months, has lack of transportation kept you from medical appointments or from getting medications?: No    Lack of Transportation (Non-Medical): No  Physical Activity: Not on file  Stress: Not on file  Social Connections: Not on file    Review of Systems: A 12 point ROS discussed and pertinent positives are indicated in the HPI above.  All other systems are negative.  Review of Systems  Vital Signs: There were no vitals taken for this visit.  Physical Exam Constitutional:      General: She is not in acute distress.    Appearance: Normal appearance.  HENT:      Head: Normocephalic and atraumatic.  Eyes:     General: No scleral icterus. Cardiovascular:     Rate and Rhythm: Normal rate.  Pulmonary:     Effort: Pulmonary effort is normal.  Abdominal:     General: There is no distension.     Tenderness: There is no abdominal tenderness. There is no guarding.  Musculoskeletal:       Back:     Comments: Focal TTP at the T8 spinous process  Skin:    General: Skin is warm and dry.  Neurological:     Mental Status: She is alert and oriented to person, place, and time.  Psychiatric:        Mood and Affect: Mood normal.        Behavior: Behavior normal.       Imaging: MR  THORACIC SPINE WO CONTRAST Result Date: 08/21/2024 CLINICAL DATA:  Clemens several days ago with back pain radiating around the ribs. Compression fracture. EXAM: MRI THORACIC SPINE WITHOUT CONTRAST TECHNIQUE: Multiplanar, multisequence MR imaging of the thoracic spine was performed. No intravenous contrast was administered. COMPARISON:  None Available. FINDINGS: Alignment: Slightly increased thoracic kyphotic curvature. Mild scoliotic curvature convex to the left. Vertebrae: Acute or subacute compression fracture of T8 loss of height of 25% and minimal posterior bowing of the posterior margin of the T8 vertebral body but no significant encroachment upon the canal. No finding to suggest that this is anything other than a benign osteoporotic or traumatic fracture. No other bone marrow finding. Cord:  Normal Paraspinal and other soft tissues: Normal Disc levels: No disc level pathology. No acute or pre-existing herniation. No stenosis of the canal or foramina. IMPRESSION: Acute or subacute compression fracture of T8 with loss of height of 25% and minimal posterior bowing of the posterior margin of the T8 vertebral body but no significant encroachment upon the canal or foramina. No finding to suggest that this is anything other than a benign osteoporotic or traumatic fracture. Electronically  Signed   By: Oneil Officer M.D.   On: 08/21/2024 15:24    Labs:  CBC: No results for input(s): WBC, HGB, HCT, PLT in the last 8760 hours.  COAGS: No results for input(s): INR, APTT in the last 8760 hours.  BMP: No results for input(s): NA, K, CL, CO2, GLUCOSE, BUN, CALCIUM, CREATININE, GFRNONAA, GFRAA in the last 8760 hours.  Invalid input(s): CMP  LIVER FUNCTION TESTS: No results for input(s): BILITOT, AST, ALT, ALKPHOS, PROT, ALBUMIN in the last 8760 hours.  TUMOR MARKERS: No results for input(s): AFPTM, CEA, CA199, CHROMGRNA in the last 8760 hours.  Assessment & Plan:   Patient has suffered subacute osteoporotic fracture of the T8 vertebra.   History and exam have demonstrated the following:  Acute/Subacute fracture by imaging dated 08/21/24, Pain on exam concordant with level of fracture, Failure of conservative therapy and pain refractory to narcotic pain mediation, and Significant disability on the [employer] Disability Questionnaire with 24/24 positive symptoms, reflecting significant impact/impairment of (ADLs)   ICD-10-CM Codes that Support Medical Necessity (WelshBlog.at.aspx?articleId=[email or web address])  M80.08XA    Age-related osteoporosis with current pathological fracture, vertebra(e), initial encounter for fracture   Plan:  T8 vertebral body augmentation with balloon kyphoplasty  Post-procedure disposition: outpatient [facility]  Medication holds: Eliquis  The patient has suffered a fracture of the T8 vertebral body. It is recommended that patients aged 61 years or older be evaluated for possible testing or treatment of osteoporosis. A copy of this consult report is sent to the patient's referring physician.     Total time spent on today's visit was over  40 Minutes  including both face-to-face time and non face-to-face time, personally spent on review of chart (including  labs and relevant imaging), discussing further workup and treatment options, referral to specialist if needed, reviewing outside records if pertinent, answering patient questions, and coordinating care regarding [email or web address]    Age-related osteoporosis with current pathological fracture, vertebra(e), initial encounter for fracture as well as management strategy.     Electronically Signed: Clotilda DELENA Hesselbach PA-C 08/28/2024, 8:08 AM   Signed,  Wilkie LOIS Lent, MD  Pager: [phone] Clinic: [phone]

## 2024-08-29 LAB — CBC WITH DIFFERENTIAL/PLATELET
Absolute Lymphocytes: 1528 {cells}/uL (ref 850–3900)
Absolute Monocytes: 395 {cells}/uL (ref 200–950)
Basophils Absolute: 52 {cells}/uL (ref 0–200)
Basophils Relative: 1.1 %
Eosinophils Absolute: 150 {cells}/uL (ref 15–500)
Eosinophils Relative: 3.2 %
HCT: 36 % (ref 35.0–45.0)
Hemoglobin: 11.8 g/dL (ref 11.7–15.5)
MCH: 33 pg (ref 27.0–33.0)
MCHC: 32.8 g/dL (ref 32.0–36.0)
MCV: 100.6 fL — ABNORMAL HIGH (ref 80.0–100.0)
MPV: 13.3 fL — ABNORMAL HIGH (ref 7.5–12.5)
Monocytes Relative: 8.4 %
Neutro Abs: 2576 {cells}/uL (ref 1500–7800)
Neutrophils Relative %: 54.8 %
Platelets: 267 Thousand/uL (ref 140–400)
RBC: 3.58 Million/uL — ABNORMAL LOW (ref 3.80–5.10)
RDW: 11.8 % (ref 11.0–15.0)
Total Lymphocyte: 32.5 %
WBC: 4.7 Thousand/uL (ref 3.8–10.8)

## 2024-08-29 LAB — COMPLETE METABOLIC PANEL WITHOUT GFR
AG Ratio: 1.4 (calc) (ref 1.0–2.5)
ALT: 11 U/L (ref 6–29)
AST: 14 U/L (ref 10–35)
Albumin: 3.8 g/dL (ref 3.6–5.1)
Alkaline phosphatase (APISO): 74 U/L (ref 37–153)
BUN/Creatinine Ratio: 14 (calc) (ref 6–22)
BUN: 8 mg/dL (ref 7–25)
CO2: 31 mmol/L (ref 20–32)
Calcium: 9 mg/dL (ref 8.6–10.4)
Chloride: 99 mmol/L (ref 98–110)
Creat: 0.59 mg/dL — ABNORMAL LOW (ref 0.60–1.00)
Globulin: 2.8 g/dL (ref 1.9–3.7)
Glucose, Bld: 90 mg/dL (ref 65–99)
Potassium: 4.1 mmol/L (ref 3.5–5.3)
Sodium: 135 mmol/L (ref 135–146)
Total Bilirubin: 0.4 mg/dL (ref 0.2–1.2)
Total Protein: 6.6 g/dL (ref 6.1–8.1)

## 2024-08-29 LAB — HOUSE ACCOUNT TRACKING

## 2024-08-29 LAB — CP4508-PT/INR AND PTT
INR: 1.3 — ABNORMAL HIGH
Prothrombin Time: 13.7 s — ABNORMAL HIGH (ref 9.0–11.5)
aPTT: 30 s (ref 23–32)

## 2024-08-30 ENCOUNTER — Other Ambulatory Visit

## 2024-08-30 ENCOUNTER — Other Ambulatory Visit: Payer: Self-pay | Admitting: Interventional Radiology

## 2024-08-30 DIAGNOSIS — M8008XA Age-related osteoporosis with current pathological fracture, vertebra(e), initial encounter for fracture: Secondary | ICD-10-CM

## 2024-09-04 NOTE — Discharge Instructions (Signed)

## 2024-09-05 ENCOUNTER — Telehealth: Payer: Self-pay

## 2024-09-06 ENCOUNTER — Ambulatory Visit
Admission: RE | Admit: 2024-09-06 | Discharge: 2024-09-06 | Disposition: A | Discharge status: Home / Self Care | Source: Ambulatory Visit | Attending: Interventional Radiology | Admitting: Interventional Radiology

## 2024-09-06 ENCOUNTER — Telehealth: Payer: Self-pay

## 2024-09-06 DIAGNOSIS — M8008XA Age-related osteoporosis with current pathological fracture, vertebra(e), initial encounter for fracture: Secondary | ICD-10-CM | POA: Diagnosis not present

## 2024-09-06 MED ORDER — ACETAMINOPHEN 10 MG/ML IV SOLN
1000.0000 mg | Freq: Once | INTRAVENOUS | Status: AC
  Administered 2024-09-06: 1000 mg via INTRAVENOUS

## 2024-09-06 MED ORDER — LIDOCAINE HCL (PF) 1 % IJ SOLN
10.0000 mL | Freq: Once | INTRAMUSCULAR | Status: AC
  Administered 2024-09-06: 20 mL via INTRADERMAL

## 2024-09-06 MED ORDER — SODIUM CHLORIDE 0.9 % IV SOLN: INTRAVENOUS | Status: DC

## 2024-09-06 MED ORDER — VANCOMYCIN HCL IN DEXTROSE 1-5 GM/200ML-% IV SOLN
1000.0000 mg | INTRAVENOUS | Status: AC
  Administered 2024-09-06: 1000 mg via INTRAVENOUS

## 2024-09-06 MED ORDER — MIDAZOLAM HCL 2 MG/2ML IJ SOLN
1.0000 mg | INTRAMUSCULAR | Status: DC | PRN
  Administered 2024-09-06 (×3): 1 mg via INTRAVENOUS

## 2024-09-06 MED ORDER — FENTANYL CITRATE PF 50 MCG/ML IJ SOSY
25.0000 ug | PREFILLED_SYRINGE | INTRAMUSCULAR | Status: DC | PRN
  Administered 2024-09-06 (×4): 50 ug via INTRAVENOUS

## 2024-09-14 ENCOUNTER — Other Ambulatory Visit: Payer: Self-pay | Admitting: Interventional Radiology

## 2024-09-14 DIAGNOSIS — M8008XA Age-related osteoporosis with current pathological fracture, vertebra(e), initial encounter for fracture: Secondary | ICD-10-CM

## 2024-09-17 DIAGNOSIS — M8589 Other specified disorders of bone density and structure, multiple sites: Secondary | ICD-10-CM | POA: Diagnosis not present

## 2024-09-20 ENCOUNTER — Ambulatory Visit
Admission: RE | Admit: 2024-09-20 | Discharge: 2024-09-20 | Disposition: A | Discharge status: Home / Self Care | Source: Ambulatory Visit | Attending: Interventional Radiology | Admitting: Interventional Radiology

## 2024-09-20 DIAGNOSIS — M8008XA Age-related osteoporosis with current pathological fracture, vertebra(e), initial encounter for fracture: Secondary | ICD-10-CM

## 2024-09-20 DIAGNOSIS — M8008XD Age-related osteoporosis with current pathological fracture, vertebra(e), subsequent encounter for fracture with routine healing: Secondary | ICD-10-CM | POA: Diagnosis not present

## 2024-09-20 DIAGNOSIS — Z9889 Other specified postprocedural states: Secondary | ICD-10-CM | POA: Diagnosis not present

## 2024-09-20 NOTE — Progress Notes (Signed)
 Chief Complaint: Patient was seen in consultation today for osteoporotic compression fracture of T8, subsequent visit at the request of Maxen Rowland K  Referring Physician(s): Jakori Burkett K  History of Present Illness: Summer Ramirez is a 73 y.o. female with past medical history significant for GERD, HTN, HLD, severe tricuspid regurgitation, paroxysmal a.fib on Eliquis who presents today to discuss treatment options for T8 compression fracture. [name] was walking her dog on 08/13/24 when her dog pulled her over while trying to chase something causing her to land on her chest and arms, particularly on her left side. Immediately after the fall she experienced diffuse back pain with some pain radiating to the front of her rib cage on both sides. She tried over the counter pain medications as well as hydrocodone with little relief so she presented to her PCP's office on 9/20 for further evaluation. She underwent a 3 view CXR as well as thoracic and lumbar x-rays which were notable for wedge compression fracture in the mid thoracic vertebrae with approximately 30-40% height loss. She was prescribed Percocet for pain management and referred to orthopedics.   She was seen by orthopedics (Dr. Rudolpho) on 9/23 and and MRI of the thoracic spine was obtained for further evaluation of the fracture. MRI showed:   Acute or subacute compression fracture of T8 with loss of height of 25% and minimal posterior bowing of the posterior margin of the T8 vertebral body but no significant encroachment upon the canal or foramina. No finding to suggest that this is anything other than a benign osteoporotic or traumatic fracture.  She underwent cement augmentation with balloon kyphoplasty on 09/06/24 and presents to clinic today for follow-up.  She is doing well and reports her pain is down to a 3/10 compared to 30/10 previously.  She continues to have some rib discomfort and some discomfort at  the costosternal interface.    Past Medical History:  Diagnosis Date   Diverticulosis    GERD (gastroesophageal reflux disease)    Hyperlipidemia    Hypertension    Hypothyroid    Thyroid  disease     Past Surgical History:  Procedure Laterality Date   colonoscopy     ESOPHAGOGASTRODUODENOSCOPY     EYE SURGERY     IR KYPHO THORACIC WITH BONE BIOPSY  09/06/2024   IR RADIOLOGIST EVAL & MGMT  08/28/2024   TUBAL LIGATION      Allergies: Penicillins and Sulfa antibiotics  Medications: Prior to Admission medications   Medication Sig Start Date End Date Taking? Authorizing Provider  acetaminophen (TYLENOL) 500 MG tablet Take by mouth.    [provider]  ALPRAZolam (XANAX) 0.25 MG tablet Take by mouth.    [provider]  citalopram (CELEXA) 20 MG tablet Take 20 mg by mouth daily. 02/22/22   [provider]  ELIQUIS 5 MG TABS tablet SMARTSIG:1 Tablet(s) By Mouth Every 12 Hours 04/07/22   [provider]  gabapentin (NEURONTIN) 300 MG capsule Take 300 mg by mouth at bedtime. 02/12/22   [provider]  HYDROcodone-acetaminophen (NORCO/VICODIN) 5-325 MG tablet Take 1 tablet by mouth every 6 (six) hours as needed for moderate pain (pain score 4-6).    [provider]  levothyroxine (SYNTHROID) 125 MCG tablet Take by mouth.    [provider]  losartan-hydrochlorothiazide (HYZAAR) 100-25 MG tablet Take 1 tablet by mouth daily. 02/09/22   [provider]  lovastatin (MEVACOR) 40 MG tablet TAKE 1 TABLET BY MOUTH EVERY DAY WITH DINNER  01/27/21   [provider]  mirabegron  ER (MYRBETRIQ ) 50 MG TB24 tablet Take 1 tablet (50 mg total) by mouth daily. 05/23/24   Vaillancourt, Samantha, PA-C  Multiple Vitamin (MULTI-VITAMIN) tablet Take 1 tablet by mouth daily.    [provider]  omeprazole (PRILOSEC) 20 MG capsule Take 20 mg by mouth 2 (two) times daily. 03/02/22   [provider]  prednisoLONE acetate  (PRED FORTE) 1 % ophthalmic suspension Apply to eye. 07/07/20   [provider]  timolol (TIMOPTIC) 0.5 % ophthalmic solution Place 1 drop into both eyes 2 (two) times daily. 03/09/21   [provider]     Family History  Problem Relation Age of Onset   Breast cancer Maternal Aunt     Social History   Socioeconomic History   Marital status: Married    Spouse name: Not on file   Number of children: Not on file   Years of education: Not on file   Highest education level: Not on file  Occupational History   Not on file  Tobacco Use   Smoking status: Never   Smokeless tobacco: Never  Substance and Sexual Activity   Alcohol  use: Not on file   Drug use: Not on file   Sexual activity: Not on file  Other Topics Concern   Not on file  Social History Narrative   Not on file   Social Drivers of Health   Financial Resource Strain: Low Risk  (05/31/2024)   Received from [facility]   Overall Financial Resource Strain (CARDIA)    Difficulty of Paying Living Expenses: Not hard at all  Food Insecurity: No Food Insecurity (05/31/2024)   Received from [facility]   Hunger Vital Sign    Within the past 12 months, you worried that your food would run out before you got the money to buy more.: Never true    Within the past 12 months, the food you bought just didn't last and you didn't have money to get more.: Never true  Transportation Needs: No Transportation Needs (05/31/2024)   Received from [facility]    In the past 12 months, has lack of transportation kept you from medical appointments or from getting medications?: No    Lack of Transportation (Non-Medical): No  Physical Activity: Not on file  Stress: Not on file  Social Connections: Not on file    Review of Systems: A 12 point ROS discussed and pertinent positives are indicated in the HPI above.  All other systems are negative.  Review  of Systems  Vital Signs: BP (!) 144/91   Pulse 65   Temp 98 F (36.7 C)   Resp 16   SpO2 100%   Advance Care Plan: The advanced care plan/surrogate decision maker was discussed at the time of visit and the patient did not wish to discuss or was not able to name a surrogate decision maker or provide an advance care plan.    Physical Exam Constitutional:      General: She is not in acute distress.    Appearance: Normal appearance. She is normal weight.  HENT:     Head: Normocephalic and atraumatic.  Eyes:     General: No scleral icterus. Pulmonary:     Effort: Pulmonary effort is normal.  Abdominal:     General: There is no distension.     Tenderness: There is no abdominal tenderness. There is no guarding.  Musculoskeletal:        General: No swelling or tenderness.  Skin:    General: Skin is warm and dry.  Neurological:     Mental Status: She is alert and oriented to person, place, and time.  Psychiatric:        Mood and Affect: Mood normal.        Behavior: Behavior normal.        Imaging: IR KYPHO THORACIC WITH BONE BIOPSY Result Date: 09/06/2024 CLINICAL DATA:  73 year old female with a highly symptomatic subacute osteoporotic compression fracture of the T8 vertebral body. She presents for cement augmentation. EXAM: FLUOROSCOPIC GUIDED KYPHOPLASTY OF THE T8 VERTEBRAL BODY COMPARISON:  MRI thoracic spine 08/21/2024 MEDICATIONS: As antibiotic prophylaxis, 1 g vancomycin was ordered pre-procedure and administered intravenously by the Radiology nurse within 1 hour of incision. ANESTHESIA/SEDATION: Moderate (conscious) sedation was employed during this procedure. A total of Versed  3 mg and Fentanyl  200 mcg was administered intravenously by the Radiology nurse. Moderate Sedation Time: 26 minutes. The patient's level of consciousness and vital signs were monitored continuously by radiology nursing throughout the procedure under my direct supervision. FLUOROSCOPY TIME:  Radiation  exposure index: 24.3 mGy, air kerma COMPLICATIONS: None immediate. PROCEDURE: The procedure, risks (including but not limited to bleeding, infection, organ damage), benefits, and alternatives were explained to the patient. Questions regarding the procedure were encouraged and answered. The patient understands and consents to the procedure. The patient has suffered a fracture of the T8 vertebral body. The patient was placed prone on the fluoroscopic table. The skin overlying the thoracic region was then prepped and draped in the usual sterile fashion. Maximal barrier sterile technique was utilized including caps, mask, sterile gowns, sterile gloves, sterile drape, hand hygiene and skin antiseptic. Intravenous Fentanyl  and Versed  were administered as conscious sedation during continuous cardiorespiratory monitoring by the radiology RN. The left pedicle at T8 was then infiltrated with 1% lidocaine followed by the advancement of a Kyphon trocar needle through the left pedicle into the posterior one-third of the vertebral body. Subsequently, the osteo drill was advanced to the anterior third of the vertebral body. The osteo drill was retracted. Through the working cannula, a Kyphon inflatable bone tamp 15 x 2.5 was advanced and positioned with the distal marker approximately 5 mm from the anterior aspect of the cortex. Appropriate positioning was confirmed on the AP projection. At this time, the balloon was expanded using contrast via a Kyphon inflation syringe device via micro tubing. In similar fashion, the right T8 pedicle was infiltrated with 1% lidocaine followed by the advancement of a second Kyphon trocar needle through the right pedicle into the posterior third of the vertebral body. Subsequently, the osteo drill was coaxially advanced to the anterior right third. The osteo drill was exchanged for a Kyphon inflatable bone tamp 15 x 2.5, advanced to the 5 mm of the anterior aspect of the cortex. The balloon was  then expanded using contrast as above. Inflations were continued until there was near apposition with the superior end plate. At this time, methylmethacrylate mixture was reconstituted in the Kyphon bone mixing device system. This was then loaded into the delivery mechanism, attached to Kyphon bone fillers. The balloons were deflated and removed followed by the instillation of methylmethacrylate mixture with excellent filling in the AP and lateral projections. Small volume extravasation into the T8-T9 disc space. No epidural venous contamination was seen. The working cannulae and the bone filler were then retrieved and removed. Hemostasis was achieved with  manual compression. The patient tolerated the procedure well without immediate postprocedural complication. IMPRESSION: 1. Technically successful T8 vertebral body augmentation using balloon kyphoplasty. 2. Per CMS PQRS reporting requirements (PQRS Measure 24): Given the patient's age of greater than 50 and the fracture site (hip, distal radius, or spine), the patient should be tested for osteoporosis using DXA, and the appropriate treatment considered based on the DXA results. Electronically Signed   By: Wilkie Lent M.D.   On: 09/06/2024 11:27   IR Radiologist Eval & Mgmt Result Date: 08/28/2024 EXAM: NEW PATIENT OFFICE VISIT CHIEF COMPLAINT: SEE NOTE IN EPIC HISTORY OF PRESENT ILLNESS: SEE NOTE IN EPIC REVIEW OF SYSTEMS: SEE NOTE IN EPIC PHYSICAL EXAMINATION: SEE NOTE IN EPIC ASSESSMENT AND PLAN: SEE NOTE IN EPIC Electronically Signed   By: Wilkie Lent M.D.   On: 08/28/2024 10:48   MR THORACIC SPINE WO CONTRAST Result Date: 08/21/2024 CLINICAL DATA:  Clemens several days ago with back pain radiating around the ribs. Compression fracture. EXAM: MRI THORACIC SPINE WITHOUT CONTRAST TECHNIQUE: Multiplanar, multisequence MR imaging of the thoracic spine was performed. No intravenous contrast was administered. COMPARISON:  None Available. FINDINGS:  Alignment: Slightly increased thoracic kyphotic curvature. Mild scoliotic curvature convex to the left. Vertebrae: Acute or subacute compression fracture of T8 loss of height of 25% and minimal posterior bowing of the posterior margin of the T8 vertebral body but no significant encroachment upon the canal. No finding to suggest that this is anything other than a benign osteoporotic or traumatic fracture. No other bone marrow finding. Cord:  Normal Paraspinal and other soft tissues: Normal Disc levels: No disc level pathology. No acute or pre-existing herniation. No stenosis of the canal or foramina. IMPRESSION: Acute or subacute compression fracture of T8 with loss of height of 25% and minimal posterior bowing of the posterior margin of the T8 vertebral body but no significant encroachment upon the canal or foramina. No finding to suggest that this is anything other than a benign osteoporotic or traumatic fracture. Electronically Signed   By: Oneil Officer M.D.   On: 08/21/2024 15:24    Labs:  CBC: Recent Labs    08/28/24 1030  WBC 4.7  HGB 11.8  HCT 36.0  PLT 267    COAGS: Recent Labs    08/28/24 1030  INR 1.3*  APTT 30    BMP: Recent Labs    08/28/24 1030  NA 135  K 4.1  CL 99  CO2 31  GLUCOSE 90  BUN 8  CALCIUM 9.0  CREATININE 0.59*    LIVER FUNCTION TESTS: Recent Labs    08/28/24 1030  BILITOT 0.4  AST 14  ALT 11  PROT 6.6    TUMOR MARKERS: No results for input(s): AFPTM, CEA, CA199, CHROMGRNA in the last 8760 hours.  Assessment and Plan:  73 yo female with an osteoporotic compression fracture of T8 now 2 weeks post cement augmentation with an excellent clinical result.    She has an appointment with her PCP the first week of November and plans to discuss treatment for her osteoporosis.   - No further scheduled follow-up    Electronically Signed: Wilkie MARLA Lent 09/20/2024, 10:38 AM   I spent a total of  15 Minutes in face to face in  clinical consultation, greater than 50% of which was counseling/coordinating care for T8 osteoporotic compression fracture

## 2024-09-28 DIAGNOSIS — R7302 Impaired glucose tolerance (oral): Secondary | ICD-10-CM | POA: Diagnosis not present

## 2024-09-28 DIAGNOSIS — E039 Hypothyroidism, unspecified: Secondary | ICD-10-CM | POA: Diagnosis not present

## 2024-10-01 DIAGNOSIS — M170 Bilateral primary osteoarthritis of knee: Secondary | ICD-10-CM | POA: Diagnosis not present

## 2024-10-02 DIAGNOSIS — M8589 Other specified disorders of bone density and structure, multiple sites: Secondary | ICD-10-CM | POA: Diagnosis not present

## 2024-10-05 DIAGNOSIS — E039 Hypothyroidism, unspecified: Secondary | ICD-10-CM | POA: Diagnosis not present

## 2024-10-05 DIAGNOSIS — I10 Essential (primary) hypertension: Secondary | ICD-10-CM | POA: Diagnosis not present

## 2024-10-05 DIAGNOSIS — K219 Gastro-esophageal reflux disease without esophagitis: Secondary | ICD-10-CM | POA: Diagnosis not present

## 2024-10-05 DIAGNOSIS — F324 Major depressive disorder, single episode, in partial remission: Secondary | ICD-10-CM | POA: Diagnosis not present

## 2024-10-05 DIAGNOSIS — R7303 Prediabetes: Secondary | ICD-10-CM | POA: Diagnosis not present

## 2024-10-05 DIAGNOSIS — I480 Paroxysmal atrial fibrillation: Secondary | ICD-10-CM | POA: Diagnosis not present

## 2024-11-26 ENCOUNTER — Other Ambulatory Visit: Payer: Self-pay | Admitting: Internal Medicine

## 2024-11-26 DIAGNOSIS — Z1231 Encounter for screening mammogram for malignant neoplasm of breast: Secondary | ICD-10-CM

## 2025-01-03 ENCOUNTER — Ambulatory Visit
Admission: RE | Admit: 2025-01-03 | Discharge: 2025-01-03 | Disposition: A | Source: Ambulatory Visit | Attending: Internal Medicine

## 2025-01-03 DIAGNOSIS — Z1231 Encounter for screening mammogram for malignant neoplasm of breast: Secondary | ICD-10-CM

## 2025-05-23 ENCOUNTER — Ambulatory Visit: Admitting: Physician Assistant
